# Patient Record
Sex: Female | Born: 1980 | Race: White | Hispanic: No | Marital: Married | Smoking: Former smoker
Health system: Southern US, Community
[De-identification: ages and names within clinical notes are randomized; demographics above are authoritative.]

## PROBLEM LIST (undated history)

## (undated) DIAGNOSIS — F5104 Psychophysiologic insomnia: Secondary | ICD-10-CM

## (undated) DIAGNOSIS — T839XXA Unspecified complication of genitourinary prosthetic device, implant and graft, initial encounter: Secondary | ICD-10-CM

## (undated) DIAGNOSIS — F952 Tourette's disorder: Secondary | ICD-10-CM

## (undated) DIAGNOSIS — N390 Urinary tract infection, site not specified: Secondary | ICD-10-CM

## (undated) DIAGNOSIS — J329 Chronic sinusitis, unspecified: Secondary | ICD-10-CM

## (undated) DIAGNOSIS — O1490 Unspecified pre-eclampsia, unspecified trimester: Secondary | ICD-10-CM

## (undated) DIAGNOSIS — D649 Anemia, unspecified: Secondary | ICD-10-CM

## (undated) DIAGNOSIS — G43909 Migraine, unspecified, not intractable, without status migrainosus: Secondary | ICD-10-CM

## (undated) HISTORY — PX: WISDOM TOOTH EXTRACTION: SHX21

## (undated) HISTORY — DX: Tourette's disorder: F95.2

## (undated) HISTORY — DX: Chronic sinusitis, unspecified: J32.9

## (undated) HISTORY — DX: Urinary tract infection, site not specified: N39.0

## (undated) HISTORY — DX: Unspecified complication of genitourinary prosthetic device, implant and graft, initial encounter: T83.9XXA

## (undated) HISTORY — DX: Unspecified pre-eclampsia, unspecified trimester: O14.90

## (undated) HISTORY — DX: Migraine, unspecified, not intractable, without status migrainosus: G43.909

## (undated) HISTORY — DX: Psychophysiologic insomnia: F51.04

---

## 2006-10-09 ENCOUNTER — Emergency Department: Payer: Self-pay | Admitting: Emergency Medicine

## 2007-03-01 ENCOUNTER — Emergency Department: Payer: Self-pay | Admitting: Emergency Medicine

## 2007-03-16 ENCOUNTER — Emergency Department: Payer: Self-pay | Admitting: Emergency Medicine

## 2007-10-08 LAB — HM PAP SMEAR: HM Pap smear: NORMAL

## 2012-10-07 ENCOUNTER — Encounter: Payer: Self-pay | Admitting: Internal Medicine

## 2012-10-07 ENCOUNTER — Ambulatory Visit (INDEPENDENT_AMBULATORY_CARE_PROVIDER_SITE_OTHER): Payer: No Typology Code available for payment source | Admitting: Internal Medicine

## 2012-10-07 VITALS — BP 120/70 | HR 76 | Temp 98.9°F | Ht 69.5 in | Wt 164.5 lb

## 2012-10-07 DIAGNOSIS — Z0000 Encounter for general adult medical examination without abnormal findings: Secondary | ICD-10-CM

## 2012-10-07 DIAGNOSIS — J309 Allergic rhinitis, unspecified: Secondary | ICD-10-CM | POA: Insufficient documentation

## 2012-10-07 DIAGNOSIS — I7300 Raynaud's syndrome without gangrene: Secondary | ICD-10-CM

## 2012-10-07 DIAGNOSIS — F5104 Psychophysiologic insomnia: Secondary | ICD-10-CM | POA: Insufficient documentation

## 2012-10-07 DIAGNOSIS — N920 Excessive and frequent menstruation with regular cycle: Secondary | ICD-10-CM | POA: Insufficient documentation

## 2012-10-07 DIAGNOSIS — G43909 Migraine, unspecified, not intractable, without status migrainosus: Secondary | ICD-10-CM | POA: Insufficient documentation

## 2012-10-07 DIAGNOSIS — R5383 Other fatigue: Secondary | ICD-10-CM | POA: Insufficient documentation

## 2012-10-07 DIAGNOSIS — F952 Tourette's disorder: Secondary | ICD-10-CM

## 2012-10-07 DIAGNOSIS — G4700 Insomnia, unspecified: Secondary | ICD-10-CM

## 2012-10-07 HISTORY — DX: Excessive and frequent menstruation with regular cycle: N92.0

## 2012-10-07 LAB — CBC WITH DIFFERENTIAL/PLATELET
Basophils Absolute: 0 10*3/uL (ref 0.0–0.1)
Eosinophils Relative: 1.5 % (ref 0.0–5.0)
HCT: 38.7 % (ref 36.0–46.0)
Hemoglobin: 12.7 g/dL (ref 12.0–15.0)
Lymphocytes Relative: 28.4 % (ref 12.0–46.0)
Monocytes Relative: 6.1 % (ref 3.0–12.0)
Neutro Abs: 4.2 10*3/uL (ref 1.4–7.7)
RBC: 4.3 Mil/uL (ref 3.87–5.11)
RDW: 12.5 % (ref 11.5–14.6)
WBC: 6.7 10*3/uL (ref 4.5–10.5)

## 2012-10-07 LAB — COMPREHENSIVE METABOLIC PANEL
ALT: 12 U/L (ref 0–35)
Albumin: 3.8 g/dL (ref 3.5–5.2)
Alkaline Phosphatase: 50 U/L (ref 39–117)
CO2: 27 mEq/L (ref 19–32)
GFR: 125.9 mL/min (ref >60.00)
Potassium: 4.4 mEq/L (ref 3.5–5.1)
Sodium: 138 mEq/L (ref 135–145)
Total Bilirubin: 0.4 mg/dL (ref 0.3–1.2)
Total Protein: 7.4 g/dL (ref 6.0–8.3)

## 2012-10-07 LAB — TSH: TSH: 1.84 u[IU]/mL (ref 0.35–5.50)

## 2012-10-07 LAB — LIPID PANEL
LDL Cholesterol: 95 mg/dL (ref 0–99)
VLDL: 8.2 mg/dL (ref 0.0–40.0)

## 2012-10-07 MED ORDER — FLUTICASONE PROPIONATE 50 MCG/ACT NA SUSP: 2.0000 | Freq: Every day | NASAL | Status: DC

## 2012-10-07 NOTE — Assessment & Plan Note (Signed)
Symptoms of fatigue likely secondary to iron deficiency. As above, will start supplementation with ferrous sulfate. Other lab work including thyroid function, kidney and liver function, and electrolytes were normal. B12 was slightly low and will have patient start B12 supplementation as she is vegetarian. Recommended 2000 mcg sublingually daily. Followup in one month.

## 2012-10-07 NOTE — Assessment & Plan Note (Signed)
Symptoms consistent with menorrhagia. Blood counts are normal today. However, ferritin is low indicated of iron deficiency. Will have patient start ferrous sulfate 324 mg twice daily and plan to repeat CBC and ferritin level in 3 months. We also discussed potentially starting oral contraceptive to help limit to menstrual blood loss. Followup one month.

## 2012-10-07 NOTE — Assessment & Plan Note (Signed)
Symptoms of runny nose, sneezing most consistent with allergic rhinitis. No improvement with Zyrtec or Claritin. Will try adding nasal steroid. Prescription for Flonase given today. Followup one month. If no improvement, would favor referral to ENT for further evaluation including allergy testing.

## 2012-10-07 NOTE — Assessment & Plan Note (Signed)
Symptoms and exam are consistent with Raynauds syndrome. We discussed keeping a hands warm and covered. We also discussed potential use of medication such as a calcium channel blocker. Will send testing for inflammatory autoimmune disorders including ANA, rheumatoid factor, ESR. Followup one month.

## 2012-10-07 NOTE — Assessment & Plan Note (Signed)
Patient with history of migraine headaches recently worsened likely with iron deficiency anemia. Will continue to monitor. If symptoms are persisting, would favor adding rescue medication such as Maxalt.

## 2012-10-07 NOTE — Progress Notes (Signed)
Subjective:    Patient ID: Kelsey Mcguire, female    DOB: 04-Jul-1981, 31 y.o.   MRN: [id number]  HPI 31 year old female with history of migraine headaches, seasonal allergies, insomnia presents to establish care. Her primary concern today is several month history of generalized fatigue. She reports that fatigue is worse around the time of her menstrual cycle. She reports that her menstrual cycles are extremely heavy, with heavy bleeding and blood clots. They last up to a week. In the past, she was placed on oral contraceptive pills to help control blood flow but was unable to tolerate medications because of side effects. She reports that women in her family, mostly on her father's side have extremely heavy periods.  She also notes a long history of insomnia. For years, she has been taking Ambien to help with sleep. Without Ambien, she notes frequent waking at night and daytime fatigue. She questions whether there may be some underlying sleep disorder. She has never had a sleep study. She is unsure if she snores. She does have a history of Tourette's syndrome.  In regards to Tourette's syndrome, she reports that her tics are well controlled when she is rested. There made worse by increased fatigue or anxiety. They're expressed by frequent blinking and occasional grunting. She reports extensive workup for this in the past.   She is also concerned about bluish discoloration of her fingertips and pain in her distal fingers. She reports that her fingers are often "a touch. This is worse during the winter months.  Outpatient Encounter Prescriptions as of 10/07/2012  Medication Sig Dispense Refill  . Melatonin 5 MG TABS Take 1 tablet by mouth daily.      Marland Kitchen zolpidem (AMBIEN) 10 MG tablet Take 10 mg by mouth at bedtime as needed.      . fluticasone (FLONASE) 50 MCG/ACT nasal spray Place 2 sprays into the nose daily.  16 g  6   BP 120/70  Pulse 76  Temp 98.9 F (37.2 C) (Oral)  Ht 5' 9.5" (1.765  m)  Wt 164 lb 8 oz (74.617 kg)  BMI 23.94 kg/m2  SpO2 97%  LMP 09/13/2012  Review of Systems  Constitutional: Positive for fatigue. Negative for fever, chills, appetite change and unexpected weight change.  HENT: Negative for ear pain, congestion, sore throat, trouble swallowing, neck pain, voice change and sinus pressure.   Eyes: Negative for visual disturbance.  Respiratory: Negative for cough, shortness of breath, wheezing and stridor.   Cardiovascular: Negative for chest pain, palpitations and leg swelling.  Gastrointestinal: Negative for nausea, vomiting, abdominal pain, diarrhea, constipation, blood in stool, abdominal distention and anal bleeding.  Genitourinary: Positive for menstrual problem. Negative for dysuria and flank pain.  Musculoskeletal: Negative for myalgias, arthralgias and gait problem.  Skin: Negative for color change and rash.  Neurological: Positive for headaches. Negative for dizziness.  Hematological: Negative for adenopathy. Does not bruise/bleed easily.  Psychiatric/Behavioral: Positive for disturbed wake/sleep cycle. Negative for suicidal ideas and dysphoric mood. The patient is not nervous/anxious.        Objective:   Physical Exam  Constitutional: She is oriented to person, place, and time. She appears well-developed and well-nourished. No distress.  HENT:  Head: Normocephalic and atraumatic.  Right Ear: External ear normal.  Left Ear: External ear normal.  Nose: Nose normal.  Mouth/Throat: Oropharynx is clear and moist. No oropharyngeal exudate.  Eyes: Conjunctivae normal are normal. Pupils are equal, round, and reactive to light. Right eye exhibits no discharge. Left  eye exhibits no discharge. No scleral icterus.  Neck: Normal range of motion. Neck supple. No tracheal deviation present. No thyromegaly present.  Cardiovascular: Normal rate, regular rhythm, normal heart sounds and intact distal pulses.  Exam reveals no gallop and no friction rub.   No  murmur heard. Pulmonary/Chest: Effort normal and breath sounds normal. No respiratory distress. She has no wheezes. She has no rales. She exhibits no tenderness.  Abdominal: Soft. Bowel sounds are normal. She exhibits no distension. There is no tenderness.  Musculoskeletal: Normal range of motion. She exhibits no edema and no tenderness.  Lymphadenopathy:    She has no cervical adenopathy.  Neurological: She is alert and oriented to person, place, and time. No cranial nerve deficit. She exhibits normal muscle tone. Coordination normal.  Skin: Skin is warm and dry. No rash noted. She is not diaphoretic. No erythema. There is pallor (bluish discoloration distal fingers).  Psychiatric: She has a normal mood and affect. Her behavior is normal. Judgment and thought content normal.          Assessment & Plan:

## 2012-10-07 NOTE — Assessment & Plan Note (Signed)
Patient with long history of insomnia on chronic Ambien. She like to get off this medication. She would like to discuss sleep patterns with a sleep specialist to potentially undergo sleep study to see if there are any underlying issues given that she has frequent episodes of waking at night.  Will set up referral.

## 2012-10-07 NOTE — Patient Instructions (Signed)
Raynaud's Syndrome  Raynaud's Syndrome is a disorder of the blood vessels in your hands and feet. It occurs when small arteries of the arms/hands or legs/feet become sensitive to cold or emotional upset. This causes the arteries to constrict, or narrow, and reduces blood flow to the area. The color in the fingers or toes changes from white to bluish to red and this is not usually painful. There may be numbness and tingling. Sores on the skin (ulcers) can form. Symptoms are usually relieved by warming.  HOME CARE INSTRUCTIONS     Avoid exposure to cold. Keep your whole body warm and dry. Dress in layers. Wear mittens or gloves when handling ice or frozen food and when outdoors. Use holders for glasses or cans containing cold drinks. If possible, stay indoors during cold weather.   Limit your use of caffeine. Switch to decaffeinated coffee, tea, and soda pop. Avoid chocolate.   Avoid smoking or being around cigarette smoke. Smoke will make symptoms worse.   Wear loose fitting socks and comfortable, roomy shoes.   Avoid vibrating tools and machinery.   If possible, avoid stressful and emotional situations. Exercise, meditation and yoga may help you cope with stress. Biofeedback may be useful.   Ask your caregiver about medicine (calcium channel blockers) that may control Raynaud's phenomena.  SEEK MEDICAL CARE IF:     Your discomfort becomes worse, despite conservative treatment.   You develop sores on your fingers and toes that do not heal.  Document Released: 11/22/2000 Document Revised: 02/17/2012 Document Reviewed: 11/29/2008  [employer] Patient Information 2013 [employer].

## 2012-10-07 NOTE — Assessment & Plan Note (Signed)
Will request records on previous evaluation. Continue to monitor.

## 2012-10-08 LAB — ANA: Anti Nuclear Antibody(ANA): NEGATIVE

## 2012-10-08 LAB — RHEUMATOID FACTOR: Rhuematoid fact SerPl-aCnc: <10 IU/mL (ref <=14)

## 2012-10-12 ENCOUNTER — Encounter: Payer: Self-pay | Admitting: Internal Medicine

## 2012-11-04 ENCOUNTER — Encounter: Payer: Self-pay | Admitting: Internal Medicine

## 2012-11-04 ENCOUNTER — Ambulatory Visit (INDEPENDENT_AMBULATORY_CARE_PROVIDER_SITE_OTHER): Payer: No Typology Code available for payment source | Admitting: Internal Medicine

## 2012-11-04 VITALS — BP 116/60 | HR 87 | Temp 98.7°F | Ht 69.5 in

## 2012-11-04 DIAGNOSIS — D509 Iron deficiency anemia, unspecified: Secondary | ICD-10-CM | POA: Insufficient documentation

## 2012-11-04 DIAGNOSIS — N920 Excessive and frequent menstruation with regular cycle: Secondary | ICD-10-CM

## 2012-11-04 DIAGNOSIS — D510 Vitamin B12 deficiency anemia due to intrinsic factor deficiency: Secondary | ICD-10-CM | POA: Insufficient documentation

## 2012-11-04 DIAGNOSIS — R5383 Other fatigue: Secondary | ICD-10-CM

## 2012-11-04 DIAGNOSIS — R5381 Other malaise: Secondary | ICD-10-CM

## 2012-11-04 NOTE — Progress Notes (Signed)
Subjective:    Patient ID: Kelsey Mcguire, female    DOB: 1981-01-15, 31 y.o.   MRN: [id number]  HPI 31 year old female with history menorrhagia, migraines, fatigue presents for followup. At her last visit, she was noted to have both iron deficiency with ferritin of 6 and moderately low B12. She started iron supplementation with ferrous sulfate but has had some difficulty with compliance because of diarrhea with this medication. She has also started an oral B12 supplement 2000 mcg daily. She reports that fatigue and headaches are persistent. She also continues to have heavy menstrual cycles. She denies new concerns today.  Outpatient Encounter Prescriptions as of 11/04/2012  Medication Sig Dispense Refill  . cyanocobalamin 2000 MCG tablet Take 2,000 mcg by mouth daily.      . ferrous fumarate (HEMOCYTE - 106 MG FE) 325 (106 FE) MG TABS Take 1 tablet by mouth. Take one to three a day      . fluticasone (FLONASE) 50 MCG/ACT nasal spray Place 2 sprays into the nose daily.  16 g  6  . Melatonin 5 MG TABS Take 1 tablet by mouth daily.      Marland Kitchen zolpidem (AMBIEN) 10 MG tablet Take 10 mg by mouth at bedtime as needed.       BP 116/60  Pulse 87  Temp 98.7 F (37.1 C) (Oral)  Ht 5' 9.5" (1.765 m)  SpO2 98%  LMP 11/03/2012  Review of Systems  Constitutional: Positive for fatigue. Negative for fever, chills, appetite change and unexpected weight change.  HENT: Negative for ear pain, congestion, sore throat, trouble swallowing, neck pain, voice change and sinus pressure.   Eyes: Negative for visual disturbance.  Respiratory: Negative for cough, shortness of breath, wheezing and stridor.   Cardiovascular: Negative for chest pain, palpitations and leg swelling.  Gastrointestinal: Negative for nausea, vomiting, abdominal pain, diarrhea, constipation, blood in stool, abdominal distention and anal bleeding.  Genitourinary: Positive for menstrual problem. Negative for dysuria and flank pain.    Musculoskeletal: Negative for myalgias, arthralgias and gait problem.  Skin: Negative for color change and rash.  Neurological: Positive for headaches. Negative for dizziness.  Hematological: Negative for adenopathy. Does not bruise/bleed easily.  Psychiatric/Behavioral: Negative for suicidal ideas, sleep disturbance and dysphoric mood. The patient is not nervous/anxious.        Objective:   Physical Exam  Constitutional: She is oriented to person, place, and time. She appears well-developed and well-nourished. No distress.  HENT:  Head: Normocephalic and atraumatic.  Right Ear: External ear normal.  Left Ear: External ear normal.  Nose: Nose normal.  Mouth/Throat: Oropharynx is clear and moist. No oropharyngeal exudate.  Eyes: Conjunctivae normal are normal. Pupils are equal, round, and reactive to light. Right eye exhibits no discharge. Left eye exhibits no discharge. No scleral icterus.  Neck: Normal range of motion. Neck supple. No tracheal deviation present. No thyromegaly present.  Cardiovascular: Normal rate, regular rhythm, normal heart sounds and intact distal pulses.  Exam reveals no gallop and no friction rub.   No murmur heard. Pulmonary/Chest: Effort normal and breath sounds normal. No respiratory distress. She has no wheezes. She has no rales. She exhibits no tenderness.  Musculoskeletal: Normal range of motion. She exhibits no edema and no tenderness.  Lymphadenopathy:    She has no cervical adenopathy.  Neurological: She is alert and oriented to person, place, and time. No cranial nerve deficit. She exhibits normal muscle tone. Coordination normal.  Skin: Skin is warm and dry. No rash  noted. She is not diaphoretic. No erythema. No pallor.  Psychiatric: She has a normal mood and affect. Her behavior is normal. Judgment and thought content normal.          Assessment & Plan:

## 2012-11-04 NOTE — Assessment & Plan Note (Signed)
Persistent menorrhagia. Encouraged patient to consider oral contraceptives or IUD to help control menstrual blood loss.

## 2012-11-04 NOTE — Assessment & Plan Note (Signed)
Patient with iron deficiency secondary to menorrhagia. Will repeat ferritin and CBC today, as pt has been taking iron supplements x 1 month. Having some difficulty tolerating oral supplements because of diarrhea, so may need IV iron infusion if iron deficiency persistent. Encouraged her to consider treatment such as OCP to help control menstrual blood loss. Follow up 1 month.

## 2012-11-04 NOTE — Assessment & Plan Note (Addendum)
Symptoms of fatigue are persistent. Suspect related to iron deficiency anemia. Will repeat CBC and ferritin today. If iron deficiency persisting, would favor referring for IV iron infusion. Will also follow up and request results from recent sleep study, to see if sleep apnea is contributing to symptoms of fatigue.

## 2012-11-04 NOTE — Assessment & Plan Note (Signed)
Low B12 noted on recent labs. Pt has been using oral B12 supplement. Will repeat B12 level today to see if any improvement.

## 2012-11-05 LAB — CBC WITH DIFFERENTIAL/PLATELET
Basophils Absolute: 0 10*3/uL (ref 0.0–0.1)
Basophils Relative: 0 % (ref 0–1)
Eosinophils Absolute: 0.1 10*3/uL (ref 0.0–0.7)
HCT: 37.2 % (ref 36.0–46.0)
MCH: 29.6 pg (ref 26.0–34.0)
MCHC: 33.6 g/dL (ref 30.0–36.0)
Monocytes Absolute: 0.3 10*3/uL (ref 0.1–1.0)
Neutro Abs: 2.9 10*3/uL (ref 1.7–7.7)
Neutrophils Relative %: 58 % (ref 43–77)
RDW: 13.4 % (ref 11.5–15.5)

## 2012-11-05 LAB — VITAMIN B12: Vitamin B-12: 656 pg/mL (ref 211–911)

## 2012-11-09 ENCOUNTER — Encounter: Payer: Self-pay | Admitting: Internal Medicine

## 2012-11-16 ENCOUNTER — Encounter: Payer: Self-pay | Admitting: Internal Medicine

## 2012-12-03 ENCOUNTER — Other Ambulatory Visit (INDEPENDENT_AMBULATORY_CARE_PROVIDER_SITE_OTHER): Payer: No Typology Code available for payment source

## 2012-12-03 ENCOUNTER — Telehealth: Payer: Self-pay | Admitting: *Deleted

## 2012-12-03 DIAGNOSIS — Z0000 Encounter for general adult medical examination without abnormal findings: Secondary | ICD-10-CM

## 2012-12-03 LAB — CBC WITH DIFFERENTIAL/PLATELET
Eosinophils Relative: 3.7 % (ref 0.0–5.0)
HCT: 37.4 % (ref 36.0–46.0)
Hemoglobin: 12.5 g/dL (ref 12.0–15.0)
Lymphocytes Relative: 32.5 % (ref 12.0–46.0)
Lymphs Abs: 1.6 10*3/uL (ref 0.7–4.0)
Monocytes Relative: 9.2 % (ref 3.0–12.0)
Platelets: 243 10*3/uL (ref 150.0–400.0)
WBC: 5 10*3/uL (ref 4.5–10.5)

## 2012-12-03 LAB — COMPREHENSIVE METABOLIC PANEL
AST: 29 U/L (ref 0–37)
Alkaline Phosphatase: 52 U/L (ref 39–117)
BUN: 8 mg/dL (ref 6–23)
Calcium: 8.6 mg/dL (ref 8.4–10.5)
Creatinine, Ser: 0.6 mg/dL (ref 0.4–1.2)

## 2012-12-03 LAB — LIPID PANEL
Cholesterol: 130 mg/dL (ref 0–200)
LDL Cholesterol: 69 mg/dL (ref 0–99)
Triglycerides: 75 mg/dL (ref 0.0–149.0)
VLDL: 15 mg/dL (ref 0.0–40.0)

## 2012-12-03 LAB — TSH: TSH: 1.52 u[IU]/mL (ref 0.35–5.50)

## 2012-12-03 LAB — HEMOGLOBIN A1C: Hgb A1c MFr Bld: 5.5 % (ref 4.6–6.5)

## 2012-12-03 NOTE — Telephone Encounter (Signed)
What labs and dx would you like for this pt? Thank you  

## 2012-12-03 NOTE — Telephone Encounter (Signed)
CMP,CBC, lipids, TSH, Vit D, A1c V70.9

## 2012-12-04 MED ORDER — ERGOCALCIFEROL 1.25 MG (50000 UT) PO CAPS: 50000.0000 [IU] | ORAL_CAPSULE | ORAL | Status: DC

## 2012-12-04 NOTE — Addendum Note (Signed)
Addended by: Jackson Latino on: 12/04/2012 12:15 PM   Modules accepted: Orders

## 2013-01-21 ENCOUNTER — Encounter: Payer: No Typology Code available for payment source | Admitting: Internal Medicine

## 2013-01-22 ENCOUNTER — Encounter: Payer: No Typology Code available for payment source | Admitting: Internal Medicine

## 2013-01-23 ENCOUNTER — Other Ambulatory Visit: Payer: Self-pay

## 2013-02-11 ENCOUNTER — Other Ambulatory Visit: Payer: Self-pay | Admitting: Internal Medicine

## 2013-02-22 ENCOUNTER — Encounter: Payer: Self-pay | Admitting: Internal Medicine

## 2013-02-22 ENCOUNTER — Ambulatory Visit (INDEPENDENT_AMBULATORY_CARE_PROVIDER_SITE_OTHER): Payer: No Typology Code available for payment source | Admitting: Internal Medicine

## 2013-02-22 ENCOUNTER — Encounter: Payer: No Typology Code available for payment source | Admitting: Internal Medicine

## 2013-02-22 VITALS — BP 130/88 | HR 79 | Temp 98.5°F | Ht 69.5 in | Wt 175.0 lb

## 2013-02-22 DIAGNOSIS — Z309 Encounter for contraceptive management, unspecified: Secondary | ICD-10-CM | POA: Insufficient documentation

## 2013-02-22 DIAGNOSIS — L708 Other acne: Secondary | ICD-10-CM

## 2013-02-22 DIAGNOSIS — D509 Iron deficiency anemia, unspecified: Secondary | ICD-10-CM | POA: Insufficient documentation

## 2013-02-22 DIAGNOSIS — L709 Acne, unspecified: Secondary | ICD-10-CM | POA: Insufficient documentation

## 2013-02-22 DIAGNOSIS — E559 Vitamin D deficiency, unspecified: Secondary | ICD-10-CM

## 2013-02-22 DIAGNOSIS — N920 Excessive and frequent menstruation with regular cycle: Secondary | ICD-10-CM

## 2013-02-22 DIAGNOSIS — B977 Papillomavirus as the cause of diseases classified elsewhere: Secondary | ICD-10-CM

## 2013-02-22 DIAGNOSIS — R8789 Other abnormal findings in specimens from female genital organs: Secondary | ICD-10-CM

## 2013-02-22 DIAGNOSIS — IMO0002 Reserved for concepts with insufficient information to code with codable children: Secondary | ICD-10-CM | POA: Insufficient documentation

## 2013-02-22 HISTORY — DX: Acne, unspecified: L70.9

## 2013-02-22 HISTORY — DX: Iron deficiency anemia, unspecified: D50.9

## 2013-02-22 MED ORDER — DOXYCYCLINE HYCLATE 50 MG PO CAPS: 50.0000 mg | ORAL_CAPSULE | Freq: Two times a day (BID) | ORAL | Status: DC

## 2013-02-22 MED ORDER — NORGESTIM-ETH ESTRAD TRIPHASIC 0.18/0.215/0.25 MG-25 MCG PO TABS: 1.0000 | ORAL_TABLET | Freq: Every day | ORAL | Status: DC

## 2013-02-22 NOTE — Assessment & Plan Note (Signed)
Started on Doxycycline by dermatologist, however having some nausea/vomiting on this med. Encouraged her to try stopping medication for 5-7 days to see if any improvement.

## 2013-02-22 NOTE — Assessment & Plan Note (Signed)
Secondary to menorrhagia. Will check CBC and ferritin with labs today.

## 2013-02-22 NOTE — Assessment & Plan Note (Signed)
Per pt report. Will request notes on recent eval.

## 2013-02-22 NOTE — Progress Notes (Signed)
Subjective:    Patient ID: Kelsey Mcguire, female    DOB: [birth year], 32 y.o.   MRN: [id number]  HPI 31YO female with h/o menorrhagia, iron deficiency anemia presents for follow up. Notes she recently had PAP with OB, which was HPV pos and abnormal. Scheduled for colposcopy next week. Continues to have heavy menstrual bleeding despite starting OCP. Menses presently last over 10 days with heavy clotting and intermittent left lower abdominal pain. Scheduled for pelvic US in 2 weeks with OB.  Outpatient Encounter Prescriptions as of 02/22/2013  Medication Sig Dispense Refill  . clonazePAM (KLONOPIN) 1 MG tablet Take 1 mg by mouth 2 (two) times daily as needed for anxiety.      . cyanocobalamin 2000 MCG tablet Take 2,000 mcg by mouth daily.      . ferrous fumarate (HEMOCYTE - 106 MG FE) 325 (106 FE) MG TABS Take 1 tablet by mouth. Take one to three a day      . fluticasone (FLONASE) 50 MCG/ACT nasal spray Place 2 sprays into the nose daily.  16 g  6  . zolpidem (AMBIEN) 10 MG tablet Take 10 mg by mouth at bedtime as needed.      . doxycycline (VIBRAMYCIN) 50 MG capsule Take 1 capsule (50 mg total) by mouth 2 (two) times daily.  60 capsule  3  . Melatonin 5 MG TABS Take 1 tablet by mouth daily.      . Norgestimate-Ethinyl Estradiol Triphasic (ORTHO TRI-CYCLEN LO) 0.18/0.215/0.25 MG-25 MCG tab Take 1 tablet by mouth daily.  1 Package  11  . [DISCONTINUED] ergocalciferol (DRISDOL) 50000 UNITS capsule Take 1 capsule (50,000 Units total) by mouth once a week.  12 capsule  0   No facility-administered encounter medications on file as of 02/22/2013.   BP 130/88  Pulse 79  Temp(Src) 98.5 F (36.9 C) (Oral)  Ht 5' 9.5" (1.765 m)  Wt 175 lb (79.379 kg)  BMI 25.48 kg/m2  SpO2 99%  LMP 02/22/2013  Review of Systems  Constitutional: Negative for fever, chills, appetite change, fatigue and unexpected weight change.  HENT: Negative for ear pain, congestion, sore throat, trouble swallowing, neck  pain, voice change and sinus pressure.   Eyes: Negative for visual disturbance.  Respiratory: Negative for cough, shortness of breath, wheezing and stridor.   Cardiovascular: Negative for chest pain, palpitations and leg swelling.  Gastrointestinal: Negative for nausea, vomiting, abdominal pain, diarrhea, constipation, blood in stool, abdominal distention and anal bleeding.  Genitourinary: Positive for vaginal bleeding, menstrual problem and pelvic pain (left lower quad). Negative for dysuria and flank pain.  Musculoskeletal: Negative for myalgias, arthralgias and gait problem.  Skin: Negative for color change and rash.  Neurological: Negative for dizziness and headaches.  Hematological: Negative for adenopathy. Does not bruise/bleed easily.  Psychiatric/Behavioral: Negative for suicidal ideas, sleep disturbance and dysphoric mood. The patient is not nervous/anxious.        Objective:   Physical Exam  Constitutional: She is oriented to person, place, and time. She appears well-developed and well-nourished. No distress.  HENT:  Head: Normocephalic and atraumatic.  Right Ear: External ear normal.  Left Ear: External ear normal.  Nose: Nose normal.  Mouth/Throat: Oropharynx is clear and moist. No oropharyngeal exudate.  Eyes: Conjunctivae are normal. Pupils are equal, round, and reactive to light. Right eye exhibits no discharge. Left eye exhibits no discharge. No scleral icterus.  Neck: Normal range of motion. Neck supple. No tracheal deviation present. No thyromegaly present.  Cardiovascular: Normal rate,  regular rhythm, normal heart sounds and intact distal pulses.  Exam reveals no gallop and no friction rub.   No murmur heard. Pulmonary/Chest: Effort normal and breath sounds normal. No respiratory distress. She has no wheezes. She has no rales. She exhibits no tenderness.  Abdominal: Soft. She exhibits no distension and no mass. There is tenderness (left lower quad). There is no rebound  and no guarding.  Musculoskeletal: Normal range of motion. She exhibits no edema and no tenderness.  Lymphadenopathy:    She has no cervical adenopathy.  Neurological: She is alert and oriented to person, place, and time. No cranial nerve deficit. She exhibits normal muscle tone. Coordination normal.  Skin: Skin is warm and dry. No rash noted. She is not diaphoretic. No erythema. No pallor.  Psychiatric: She has a normal mood and affect. Her behavior is normal. Judgment and thought content normal.          Assessment & Plan:

## 2013-02-22 NOTE — Assessment & Plan Note (Signed)
Persistent symptoms of menorrhagia despite use of OCP. Pelvic US scheduled with OB. Question if she may have uterine fibroid or endometriosis contributing. Also discussed potential testing for VonWillebrand's disease. Will wait until GYN eval complete. Repeat CBC today.

## 2013-02-22 NOTE — Assessment & Plan Note (Signed)
Will check VIt D with labs today.

## 2013-02-23 ENCOUNTER — Encounter: Payer: Self-pay | Admitting: Internal Medicine

## 2013-02-23 LAB — CBC WITH DIFFERENTIAL/PLATELET
Eosinophils Relative: 1.1 % (ref 0.0–5.0)
HCT: 35.4 % — ABNORMAL LOW (ref 36.0–46.0)
Hemoglobin: 11.9 g/dL — ABNORMAL LOW (ref 12.0–15.0)
Lymphs Abs: 1.6 10*3/uL (ref 0.7–4.0)
Monocytes Relative: 2.3 % — ABNORMAL LOW (ref 3.0–12.0)
Neutro Abs: 4.1 10*3/uL (ref 1.4–7.7)
WBC: 6.1 10*3/uL (ref 4.5–10.5)

## 2013-02-23 LAB — FERRITIN: Ferritin: 21.1 ng/mL (ref 10.0–291.0)

## 2013-02-23 LAB — VITAMIN D 25 HYDROXY (VIT D DEFICIENCY, FRACTURES): Vit D, 25-Hydroxy: 37 ng/mL (ref 30–89)

## 2013-02-24 ENCOUNTER — Telehealth: Payer: Self-pay | Admitting: Internal Medicine

## 2013-02-24 ENCOUNTER — Encounter: Payer: Self-pay | Admitting: Internal Medicine

## 2013-02-24 NOTE — Telephone Encounter (Signed)
PAP ASCUS

## 2013-07-14 ENCOUNTER — Encounter: Payer: Self-pay | Admitting: Internal Medicine

## 2013-10-14 ENCOUNTER — Other Ambulatory Visit: Payer: Self-pay

## 2013-12-03 ENCOUNTER — Ambulatory Visit: Payer: No Typology Code available for payment source | Admitting: Internal Medicine

## 2013-12-06 ENCOUNTER — Ambulatory Visit: Payer: No Typology Code available for payment source | Admitting: Internal Medicine

## 2013-12-20 ENCOUNTER — Encounter: Payer: Self-pay | Admitting: Internal Medicine

## 2013-12-20 ENCOUNTER — Ambulatory Visit (INDEPENDENT_AMBULATORY_CARE_PROVIDER_SITE_OTHER): Payer: No Typology Code available for payment source | Admitting: Internal Medicine

## 2013-12-20 VITALS — BP 110/70 | HR 77 | Temp 98.2°F | Wt 181.0 lb

## 2013-12-20 DIAGNOSIS — J309 Allergic rhinitis, unspecified: Secondary | ICD-10-CM

## 2013-12-20 DIAGNOSIS — N920 Excessive and frequent menstruation with regular cycle: Secondary | ICD-10-CM

## 2013-12-20 DIAGNOSIS — Z0000 Encounter for general adult medical examination without abnormal findings: Secondary | ICD-10-CM

## 2013-12-20 DIAGNOSIS — G4700 Insomnia, unspecified: Secondary | ICD-10-CM

## 2013-12-20 LAB — COMPREHENSIVE METABOLIC PANEL
ALBUMIN: 3.9 g/dL (ref 3.5–5.2)
ALK PHOS: 51 U/L (ref 39–117)
ALT: 13 U/L (ref 0–35)
AST: 16 U/L (ref 0–37)
BILIRUBIN TOTAL: 0.9 mg/dL (ref 0.3–1.2)
BUN: 11 mg/dL (ref 6–23)
CO2: 26 mEq/L (ref 19–32)
Calcium: 9.5 mg/dL (ref 8.4–10.5)
Chloride: 105 mEq/L (ref 96–112)
Creatinine, Ser: 0.6 mg/dL (ref 0.4–1.2)
GFR: 122.55 mL/min (ref >60.00)
GLUCOSE: 94 mg/dL (ref 70–99)
POTASSIUM: 4.5 meq/L (ref 3.5–5.1)
SODIUM: 137 meq/L (ref 135–145)
TOTAL PROTEIN: 6.8 g/dL (ref 6.0–8.3)

## 2013-12-20 LAB — LIPID PANEL
CHOL/HDL RATIO: 3
CHOLESTEROL: 143 mg/dL (ref 0–200)
HDL: 55.4 mg/dL (ref >39.00)
LDL CALC: 78 mg/dL (ref 0–99)
Triglycerides: 49 mg/dL (ref 0.0–149.0)
VLDL: 9.8 mg/dL (ref 0.0–40.0)

## 2013-12-20 LAB — CBC WITH DIFFERENTIAL/PLATELET
BASOS PCT: 0.5 % (ref 0.0–3.0)
Basophils Absolute: 0 10*3/uL (ref 0.0–0.1)
EOS ABS: 0.2 10*3/uL (ref 0.0–0.7)
EOS PCT: 3.5 % (ref 0.0–5.0)
HEMATOCRIT: 38.3 % (ref 36.0–46.0)
Hemoglobin: 13 g/dL (ref 12.0–15.0)
LYMPHS ABS: 1.7 10*3/uL (ref 0.7–4.0)
Lymphocytes Relative: 26.3 % (ref 12.0–46.0)
MCHC: 34 g/dL (ref 30.0–36.0)
MCV: 90.6 fl (ref 78.0–100.0)
MONO ABS: 0.4 10*3/uL (ref 0.1–1.0)
Monocytes Relative: 6.4 % (ref 3.0–12.0)
Neutro Abs: 4 10*3/uL (ref 1.4–7.7)
Neutrophils Relative %: 63.3 % (ref 43.0–77.0)
PLATELETS: 266 10*3/uL (ref 150.0–400.0)
RBC: 4.23 Mil/uL (ref 3.87–5.11)
RDW: 12.5 % (ref 11.5–14.6)
WBC: 6.3 10*3/uL (ref 4.5–10.5)

## 2013-12-20 LAB — FERRITIN: Ferritin: 51.6 ng/mL (ref 10.0–291.0)

## 2013-12-20 MED ORDER — FLUTICASONE PROPIONATE 50 MCG/ACT NA SUSP: 2.0000 | Freq: Every day | NASAL | Status: DC

## 2013-12-20 NOTE — Assessment & Plan Note (Signed)
Persistent menorrhagia. Unable to tolerate oral contraceptives. Unable to tolerate IUD. Question if she may have von Willebrand's disease. Will send CBC, ferritin, and von Willebrand panel today. If von Willebrand's disease, may benefit from Stimate.

## 2013-12-20 NOTE — Progress Notes (Signed)
Subjective:    Patient ID: Kelsey Mcguire, female    DOB: 04-12-1981, 33 y.o.   MRN: [id number]  HPI 33 year old female presents for followup. At her last visit, she was concerned about menorrhagia. She followed up with OB/GYN and IUD was placed. Unfortunately, the IUD came out spontaneously during her honeymoon. She reports significant pain with this event. Since that time, she has had very heavy menstrual bleeding each month. She notes some fatigue. She questions whether she may be anemic again.  In regards to her history of abnormal Pap, she reports that she has been followed every 4 months by her OB/GYN and repeat Paps have been normal.  Outpatient Encounter Prescriptions as of 12/20/2013  Medication Sig  . clonazePAM (KLONOPIN) 1 MG tablet Take 1 mg by mouth 2 (two) times daily as needed for anxiety.  . cyanocobalamin 2000 MCG tablet Take 2,000 mcg by mouth daily.  . ferrous fumarate (HEMOCYTE - 106 MG FE) 325 (106 FE) MG TABS Take 1 tablet by mouth. Take one to three a day  . fluticasone (FLONASE) 50 MCG/ACT nasal spray Place 2 sprays into both nostrils daily.  . Melatonin 5 MG TABS Take 1 tablet by mouth daily.  Marland Kitchen zolpidem (AMBIEN) 10 MG tablet Take 10 mg by mouth at bedtime as needed.   BP 110/70  Pulse 77  Temp(Src) 98.2 F (36.8 C) (Oral)  Wt 181 lb (82.101 kg)  SpO2 99%  Review of Systems  Constitutional: Negative for fever, chills, appetite change, fatigue and unexpected weight change.  HENT: Negative for congestion, ear pain, sinus pressure, sore throat, trouble swallowing and voice change.   Eyes: Negative for visual disturbance.  Respiratory: Negative for cough, shortness of breath, wheezing and stridor.   Cardiovascular: Negative for chest pain, palpitations and leg swelling.  Gastrointestinal: Negative for nausea, vomiting, abdominal pain, diarrhea, constipation, blood in stool, abdominal distention and anal bleeding.  Genitourinary: Positive for menstrual problem.  Negative for dysuria and flank pain.  Musculoskeletal: Negative for arthralgias, gait problem, myalgias and neck pain.  Skin: Negative for color change and rash.  Neurological: Negative for dizziness and headaches.  Hematological: Negative for adenopathy. Does not bruise/bleed easily.  Psychiatric/Behavioral: Negative for suicidal ideas, sleep disturbance and dysphoric mood. The patient is not nervous/anxious.        Objective:   Physical Exam  Constitutional: She is oriented to person, place, and time. She appears well-developed and well-nourished. No distress.  HENT:  Head: Normocephalic and atraumatic.  Right Ear: External ear normal.  Left Ear: External ear normal.  Nose: Nose normal.  Mouth/Throat: Oropharynx is clear and moist. No oropharyngeal exudate.  Eyes: Conjunctivae are normal. Pupils are equal, round, and reactive to light. Right eye exhibits no discharge. Left eye exhibits no discharge. No scleral icterus.  Neck: Normal range of motion. Neck supple. No tracheal deviation present. No thyromegaly present.  Cardiovascular: Normal rate, regular rhythm, normal heart sounds and intact distal pulses.  Exam reveals no gallop and no friction rub.   No murmur heard. Pulmonary/Chest: Effort normal and breath sounds normal. No accessory muscle usage. Not tachypneic. No respiratory distress. She has no decreased breath sounds. She has no wheezes. She has no rhonchi. She has no rales. She exhibits no tenderness.  Musculoskeletal: Normal range of motion. She exhibits no edema and no tenderness.  Lymphadenopathy:    She has no cervical adenopathy.  Neurological: She is alert and oriented to person, place, and time. No cranial nerve deficit. She  exhibits normal muscle tone. Coordination normal.  Skin: Skin is warm and dry. No rash noted. She is not diaphoretic. No erythema. No pallor.  Psychiatric: She has a normal mood and affect. Her behavior is normal. Judgment and thought content  normal.          Assessment & Plan:

## 2013-12-20 NOTE — Progress Notes (Signed)
Pre-visit discussion using our clinic review tool. No additional management support is needed unless otherwise documented below in the visit note.  

## 2013-12-20 NOTE — Assessment & Plan Note (Signed)
Symptoms well controlled with Ambien. Will continue. 

## 2013-12-20 NOTE — Assessment & Plan Note (Signed)
Symptoms controlled with Flonase. Will continue.

## 2013-12-23 LAB — VON WILLEBRAND PANEL
COAGULATION FACTOR VIII: 24 % — AB (ref 73–140)
RISTOCETIN CO-FACTOR, PLASMA: 69 % (ref 42–200)
Von Willebrand Antigen, Plasma: 85 % (ref 50–217)

## 2014-01-08 ENCOUNTER — Telehealth: Payer: Self-pay | Admitting: Internal Medicine

## 2014-01-08 NOTE — Telephone Encounter (Signed)
Relevant patient education assigned to patient using Emmi. ° °

## 2014-06-20 ENCOUNTER — Encounter: Payer: Self-pay | Admitting: Internal Medicine

## 2014-06-20 ENCOUNTER — Ambulatory Visit (INDEPENDENT_AMBULATORY_CARE_PROVIDER_SITE_OTHER): Payer: No Typology Code available for payment source | Admitting: Internal Medicine

## 2014-06-20 VITALS — BP 104/66 | HR 87 | Temp 98.4°F | Ht 69.5 in | Wt 149.2 lb

## 2014-06-20 DIAGNOSIS — G4700 Insomnia, unspecified: Secondary | ICD-10-CM

## 2014-06-20 DIAGNOSIS — IMO0002 Reserved for concepts with insufficient information to code with codable children: Secondary | ICD-10-CM

## 2014-06-20 DIAGNOSIS — R8789 Other abnormal findings in specimens from female genital organs: Secondary | ICD-10-CM

## 2014-06-20 DIAGNOSIS — N920 Excessive and frequent menstruation with regular cycle: Secondary | ICD-10-CM

## 2014-06-20 DIAGNOSIS — F952 Tourette's disorder: Secondary | ICD-10-CM

## 2014-06-20 DIAGNOSIS — N921 Excessive and frequent menstruation with irregular cycle: Secondary | ICD-10-CM

## 2014-06-20 DIAGNOSIS — B977 Papillomavirus as the cause of diseases classified elsewhere: Secondary | ICD-10-CM

## 2014-06-20 NOTE — Patient Instructions (Signed)
We will set you up with Dr. Jeneen Rinks, a GYN specialist at [facility].  Follow up as needed.

## 2014-06-20 NOTE — Assessment & Plan Note (Signed)
Symptoms well controlled with Ambien. Will continue. 

## 2014-06-20 NOTE — Assessment & Plan Note (Signed)
Persistent menorrhagia. Unable to tolerate OCP or IUD. Will set up evaluation with GYN. Pt prefers to hold off on additional labs until GYN eval complete. Question if she may have VWD and might benefit from Stimate. Factor VIII level was low in the past.

## 2014-06-20 NOTE — Progress Notes (Signed)
Pre visit review using our clinic review tool, if applicable. No additional management support is needed unless otherwise documented below in the visit note. 

## 2014-06-20 NOTE — Assessment & Plan Note (Signed)
Symptoms have been well controlled with Clonazepam. Followed by psychiatry, but unable to afford q19month visits. Will follow here.

## 2014-06-20 NOTE — Assessment & Plan Note (Signed)
Will request recent follow up PAP from her GYN.

## 2014-06-20 NOTE — Progress Notes (Signed)
Subjective:    Patient ID: Kelsey Mcguire, female    DOB: 1981/03/07, 33 y.o.   MRN: [id number]  HPI 33YO female presents for follow up.  Continues to have issues with heavy, irregular periods. Has been bleeding for over 2 weeks. Feels lightheaded with standing. Frustrated by local GYN care. Has been unable to tolerate OCP because of malaise and weight gain on OCP. Had IUD placed, but this fell out. Would like to have second opinion.  In regards to Tourettes symptoms, insomnia has been well controlled with Ambien. Would like to have Rx filled here, as insurance will not cover evaluation with psychiatry.  Review of Systems  Constitutional: Negative for fever, chills, appetite change, fatigue and unexpected weight change.  Eyes: Negative for visual disturbance.  Respiratory: Negative for shortness of breath.   Cardiovascular: Negative for chest pain and leg swelling.  Gastrointestinal: Negative for abdominal pain.  Genitourinary: Positive for vaginal bleeding and menstrual problem. Negative for dysuria, frequency, flank pain and pelvic pain.  Skin: Negative for color change and rash.  Hematological: Negative for adenopathy. Does not bruise/bleed easily.  Psychiatric/Behavioral: Positive for sleep disturbance. Negative for dysphoric mood. The patient is not nervous/anxious.        Objective:    BP 104/66  Pulse 87  Temp(Src) 98.4 F (36.9 C) (Oral)  Ht 5' 9.5" (1.765 m)  Wt 149 lb 4 oz (67.699 kg)  BMI 21.73 kg/m2  SpO2 98%  LMP 06/17/2014 Physical Exam  Constitutional: She is oriented to person, place, and time. She appears well-developed and well-nourished. No distress.  HENT:  Head: Normocephalic and atraumatic.  Right Ear: External ear normal.  Left Ear: External ear normal.  Nose: Nose normal.  Mouth/Throat: Oropharynx is clear and moist. No oropharyngeal exudate.  Eyes: Conjunctivae are normal. Pupils are equal, round, and reactive to light. Right eye exhibits no  discharge. Left eye exhibits no discharge. No scleral icterus.  Neck: Normal range of motion. Neck supple. No tracheal deviation present. No thyromegaly present.  Cardiovascular: Normal rate, regular rhythm, normal heart sounds and intact distal pulses.  Exam reveals no gallop and no friction rub.   No murmur heard. Pulmonary/Chest: Effort normal and breath sounds normal. No accessory muscle usage. Not tachypneic. No respiratory distress. She has no decreased breath sounds. She has no wheezes. She has no rhonchi. She has no rales. She exhibits no tenderness.  Musculoskeletal: Normal range of motion. She exhibits no edema and no tenderness.  Lymphadenopathy:    She has no cervical adenopathy.  Neurological: She is alert and oriented to person, place, and time. No cranial nerve deficit. She exhibits normal muscle tone. Coordination normal.  Skin: Skin is warm and dry. No rash noted. She is not diaphoretic. No erythema. No pallor.  Psychiatric: She has a normal mood and affect. Her behavior is normal. Judgment and thought content normal.          Assessment & Plan:   Problem List Items Addressed This Visit     Unprioritized   Abnormal cervical Pap smear with positive HPV DNA test     Will request recent follow up PAP from her GYN.    Insomnia     Symptoms well controlled with Ambien. Will continue.    Menorrhagia - Primary     Persistent menorrhagia. Unable to tolerate OCP or IUD. Will set up evaluation with GYN. Pt prefers to hold off on additional labs until GYN eval complete. Question if she may have VWD  and might benefit from Stimate. Factor VIII level was low in the past.    Relevant Orders      Ambulatory referral to Gynecology   Tourette syndrome     Symptoms have been well controlled with Clonazepam. Followed by psychiatry, but unable to afford q61month visits. Will follow here.        Return in about 6 months (around 12/21/2014) for Physical.

## 2014-12-27 ENCOUNTER — Ambulatory Visit: Payer: No Typology Code available for payment source | Admitting: Internal Medicine

## 2015-01-27 ENCOUNTER — Encounter: Payer: Self-pay | Admitting: Internal Medicine

## 2015-01-27 ENCOUNTER — Ambulatory Visit (INDEPENDENT_AMBULATORY_CARE_PROVIDER_SITE_OTHER): Payer: No Typology Code available for payment source | Admitting: Internal Medicine

## 2015-01-27 VITALS — BP 109/73 | HR 75 | Temp 98.8°F | Ht 69.5 in | Wt 161.2 lb

## 2015-01-27 DIAGNOSIS — M7520 Bicipital tendinitis, unspecified shoulder: Secondary | ICD-10-CM | POA: Insufficient documentation

## 2015-01-27 DIAGNOSIS — N921 Excessive and frequent menstruation with irregular cycle: Secondary | ICD-10-CM

## 2015-01-27 DIAGNOSIS — M7521 Bicipital tendinitis, right shoulder: Secondary | ICD-10-CM

## 2015-01-27 DIAGNOSIS — F952 Tourette's disorder: Secondary | ICD-10-CM

## 2015-01-27 LAB — CBC WITH DIFFERENTIAL/PLATELET
BASOS ABS: 0 10*3/uL (ref 0.0–0.1)
BASOS PCT: 0.5 % (ref 0.0–3.0)
Eosinophils Absolute: 0.2 10*3/uL (ref 0.0–0.7)
Eosinophils Relative: 4.1 % (ref 0.0–5.0)
HCT: 37.7 % (ref 36.0–46.0)
HEMOGLOBIN: 13 g/dL (ref 12.0–15.0)
Lymphocytes Relative: 30.5 % (ref 12.0–46.0)
Lymphs Abs: 1.5 10*3/uL (ref 0.7–4.0)
MCHC: 34.5 g/dL (ref 30.0–36.0)
MCV: 90.3 fl (ref 78.0–100.0)
Monocytes Absolute: 0.3 10*3/uL (ref 0.1–1.0)
Monocytes Relative: 6.1 % (ref 3.0–12.0)
NEUTROS PCT: 58.8 % (ref 43.0–77.0)
Neutro Abs: 3 10*3/uL (ref 1.4–7.7)
Platelets: 233 10*3/uL (ref 150.0–400.0)
RBC: 4.18 Mil/uL (ref 3.87–5.11)
RDW: 12.6 % (ref 11.5–15.5)
WBC: 5.1 10*3/uL (ref 4.0–10.5)

## 2015-01-27 LAB — COMPREHENSIVE METABOLIC PANEL
ALT: 13 U/L (ref 0–35)
AST: 15 U/L (ref 0–37)
Albumin: 4 g/dL (ref 3.5–5.2)
Alkaline Phosphatase: 44 U/L (ref 39–117)
BUN: 15 mg/dL (ref 6–23)
CALCIUM: 9.3 mg/dL (ref 8.4–10.5)
CHLORIDE: 105 meq/L (ref 96–112)
CO2: 27 meq/L (ref 19–32)
Creatinine, Ser: 0.6 mg/dL (ref 0.40–1.20)
GFR: 121.73 mL/min (ref >60.00)
GLUCOSE: 97 mg/dL (ref 70–99)
POTASSIUM: 4.7 meq/L (ref 3.5–5.1)
SODIUM: 137 meq/L (ref 135–145)
TOTAL PROTEIN: 6.7 g/dL (ref 6.0–8.3)
Total Bilirubin: 0.6 mg/dL (ref 0.2–1.2)

## 2015-01-27 LAB — FERRITIN: Ferritin: 50 ng/mL (ref 10.0–291.0)

## 2015-01-27 LAB — T4, FREE: Free T4: 0.93 ng/dL (ref 0.60–1.60)

## 2015-01-27 LAB — VITAMIN B12: VITAMIN B 12: 597 pg/mL (ref 211–911)

## 2015-01-27 LAB — TSH: TSH: 1.92 u[IU]/mL (ref 0.35–4.50)

## 2015-01-27 NOTE — Assessment & Plan Note (Signed)
Continued menorrhagia. No improvement with medication prescribed by OB. Discussed repeating Pelvic US to re-evaluate uterine fibroid. She prefers to hold off on this. Will check CBC, ferritin with labs.

## 2015-01-27 NOTE — Progress Notes (Signed)
Subjective:    Patient ID: Kelsey Mcguire, female    DOB: Mar 27, 1981, 34 y.o.   MRN: [id number]  HPI  34YO female presents for follow up.  Recently has been tapering Clonazepam. Menses have been very heavy, with severe cramping. Had to call out of work.  Seen at [facility] in 08/2014, had pelvic US which showed 2cm fibroid.  Last 2-3 weeks has had pain in anterior right upper arm, after injury which occurred when walking dog and he pulled suddenly on leash. Gradually seems to be getting better. Not taking anything for pain.  Past medical, surgical, family and social history per today's encounter.  Review of Systems  Constitutional: Negative for fever, chills, appetite change, fatigue and unexpected weight change.  Eyes: Negative for visual disturbance.  Respiratory: Negative for shortness of breath.   Cardiovascular: Negative for chest pain and leg swelling.  Gastrointestinal: Negative for abdominal pain, diarrhea and constipation.  Genitourinary: Positive for menstrual problem. Negative for pelvic pain.  Musculoskeletal: Positive for myalgias. Negative for arthralgias.  Skin: Negative for color change and rash.  Neurological: Negative for weakness and numbness.  Hematological: Negative for adenopathy. Does not bruise/bleed easily.  Psychiatric/Behavioral: Negative for suicidal ideas, sleep disturbance and dysphoric mood. The patient is not nervous/anxious.        Objective:    BP 109/73 mmHg  Pulse 75  Temp(Src) 98.8 F (37.1 C) (Oral)  Ht 5' 9.5" (1.765 m)  Wt 161 lb 4 oz (73.143 kg)  BMI 23.48 kg/m2  SpO2 100%  LMP 01/07/2015 Physical Exam  Constitutional: She is oriented to person, place, and time. She appears well-developed and well-nourished. No distress.  HENT:  Head: Normocephalic and atraumatic.  Right Ear: External ear normal.  Left Ear: External ear normal.  Nose: Nose normal.  Mouth/Throat: Oropharynx is clear and moist. No oropharyngeal exudate.  Eyes:  Conjunctivae and EOM are normal. Pupils are equal, round, and reactive to light. Right eye exhibits no discharge.  Neck: Normal range of motion. Neck supple. No thyromegaly present.  Cardiovascular: Normal rate, regular rhythm, normal heart sounds and intact distal pulses.  Exam reveals no gallop and no friction rub.   No murmur heard. Pulmonary/Chest: Effort normal. No respiratory distress. She has no wheezes. She has no rales.  Abdominal: Soft. Bowel sounds are normal. She exhibits no distension and no mass. There is no tenderness. There is no rebound and no guarding.  Musculoskeletal: Normal range of motion. She exhibits no edema or tenderness.       Arms: Lymphadenopathy:    She has no cervical adenopathy.  Neurological: She is alert and oriented to person, place, and time. No cranial nerve deficit. Coordination normal.  Skin: Skin is warm and dry. No rash noted. She is not diaphoretic. No erythema. No pallor.  Psychiatric: She has a normal mood and affect. Her behavior is normal. Judgment and thought content normal.          Assessment & Plan:   Problem List Items Addressed This Visit      Unprioritized   Biceps tendonitis    Right biceps tendonitis, with possible small tear. Discussed conservative management including icing area, limiting lifting and using NSAIDS. If no improvement, we discussed referral to Sports Med. Follow up in 4 weeks.      Menorrhagia - Primary    Continued menorrhagia. No improvement with medication prescribed by OB. Discussed repeating Pelvic US to re-evaluate uterine fibroid. She prefers to hold off on this. Will check  CBC, ferritin with labs.      Relevant Orders   CBC with Differential/Platelet   Comprehensive metabolic panel   TSH   T4, free   Ferritin   B12   Tourette syndrome    Symptoms slightly worse with taper of Clonazepam. Will continue to monitor.          Return in about 4 weeks (around 02/24/2015) for Physical with PAP.

## 2015-01-27 NOTE — Assessment & Plan Note (Signed)
Symptoms slightly worse with taper of Clonazepam. Will continue to monitor.

## 2015-01-27 NOTE — Progress Notes (Signed)
Pre visit review using our clinic review tool, if applicable. No additional management support is needed unless otherwise documented below in the visit note. 

## 2015-01-27 NOTE — Assessment & Plan Note (Addendum)
Right biceps tendonitis, with possible small tear. Discussed conservative management including icing area, limiting lifting and using NSAIDS. If no improvement, we discussed referral to Sports Med. Follow up in 4 weeks.

## 2015-01-27 NOTE — Patient Instructions (Signed)
Labs today.  Follow up for physical in 4 weeks.

## 2015-02-27 ENCOUNTER — Encounter: Payer: No Typology Code available for payment source | Admitting: Internal Medicine

## 2015-03-30 ENCOUNTER — Encounter: Payer: Self-pay | Admitting: Internal Medicine

## 2015-03-30 ENCOUNTER — Ambulatory Visit (INDEPENDENT_AMBULATORY_CARE_PROVIDER_SITE_OTHER): Payer: Managed Care, Other (non HMO) | Admitting: Internal Medicine

## 2015-03-30 ENCOUNTER — Other Ambulatory Visit (HOSPITAL_COMMUNITY)
Admission: RE | Admit: 2015-03-30 | Discharge: 2015-03-30 | Disposition: A | Discharge status: Home / Self Care | Payer: Managed Care, Other (non HMO) | Source: Ambulatory Visit | Attending: Internal Medicine | Admitting: Internal Medicine

## 2015-03-30 VITALS — BP 104/68 | HR 76 | Temp 98.4°F | Resp 14 | Ht 69.5 in | Wt 174.0 lb

## 2015-03-30 DIAGNOSIS — Z0000 Encounter for general adult medical examination without abnormal findings: Secondary | ICD-10-CM

## 2015-03-30 DIAGNOSIS — IMO0002 Reserved for concepts with insufficient information to code with codable children: Secondary | ICD-10-CM

## 2015-03-30 DIAGNOSIS — N920 Excessive and frequent menstruation with regular cycle: Secondary | ICD-10-CM | POA: Diagnosis not present

## 2015-03-30 DIAGNOSIS — R8789 Other abnormal findings in specimens from female genital organs: Secondary | ICD-10-CM | POA: Diagnosis not present

## 2015-03-30 DIAGNOSIS — Z01419 Encounter for gynecological examination (general) (routine) without abnormal findings: Secondary | ICD-10-CM | POA: Insufficient documentation

## 2015-03-30 DIAGNOSIS — Z1151 Encounter for screening for human papillomavirus (HPV): Secondary | ICD-10-CM | POA: Insufficient documentation

## 2015-03-30 DIAGNOSIS — B977 Papillomavirus as the cause of diseases classified elsewhere: Secondary | ICD-10-CM

## 2015-03-30 HISTORY — DX: Encounter for general adult medical examination without abnormal findings: Z00.00

## 2015-03-30 LAB — HM PAP SMEAR: HM PAP: NORMAL

## 2015-03-30 NOTE — Addendum Note (Signed)
Addended by: Karlene Einstein D on: 03/30/2015 02:44 PM   Modules accepted: Orders

## 2015-03-30 NOTE — Progress Notes (Signed)
Subjective:    Patient ID: Kelsey Mcguire, female    DOB: 03-Sep-1981, 34 y.o.   MRN: [id number]  HPI  34YO female presents for physical exam.  Concerned that she may have "hormone imbalance." Symptoms of longer menses, questions if she may have low progesterone. Worsening acne. Concerned about weight gain. Low libido.    Wt Readings from Last 3 Encounters:  03/30/15 174 lb (78.926 kg)  01/27/15 161 lb 4 oz (73.143 kg)  06/20/14 149 lb 4 oz (67.699 kg)     BP Readings from Last 3 Encounters:  03/30/15 104/68  01/27/15 109/73  06/20/14 104/66    Past medical, surgical, family and social history per today's encounter.  Review of Systems  Constitutional: Positive for diaphoresis and fatigue. Negative for fever, chills, appetite change and unexpected weight change.  Eyes: Negative for visual disturbance.  Respiratory: Negative for shortness of breath.   Cardiovascular: Negative for chest pain and leg swelling.  Gastrointestinal: Negative for nausea, vomiting, abdominal pain, diarrhea and constipation.  Endocrine: Positive for heat intolerance.  Genitourinary: Positive for menstrual problem. Negative for dyspareunia.  Musculoskeletal: Negative for myalgias and arthralgias.  Skin: Negative for color change and rash.  Hematological: Negative for adenopathy. Does not bruise/bleed easily.  Psychiatric/Behavioral: Negative for sleep disturbance and dysphoric mood. The patient is not nervous/anxious.        Objective:    BP 104/68 mmHg  Pulse 76  Temp(Src) 98.4 F (36.9 C) (Oral)  Resp 14  Ht 5' 9.5" (1.765 m)  Wt 174 lb (78.926 kg)  BMI 25.34 kg/m2  SpO2 97% Physical Exam  Constitutional: She is oriented to person, place, and time. She appears well-developed and well-nourished. No distress.  HENT:  Head: Normocephalic and atraumatic.  Right Ear: External ear normal.  Left Ear: External ear normal.  Nose: Nose normal.  Mouth/Throat: Oropharynx is clear and moist. No  oropharyngeal exudate.  Eyes: Conjunctivae are normal. Pupils are equal, round, and reactive to light. Right eye exhibits no discharge. Left eye exhibits no discharge. No scleral icterus.  Neck: Normal range of motion. Neck supple. No tracheal deviation present. No thyromegaly present.  Cardiovascular: Normal rate, regular rhythm, normal heart sounds and intact distal pulses.  Exam reveals no gallop and no friction rub.   No murmur heard. Pulmonary/Chest: Effort normal and breath sounds normal. No respiratory distress. She has no wheezes. She has no rales. She exhibits no tenderness.  Abdominal: Soft. Bowel sounds are normal. She exhibits no distension and no mass. There is no tenderness. There is no rebound and no guarding.  Genitourinary: Rectum normal, vagina normal and uterus normal. No breast swelling, tenderness, discharge or bleeding. Pelvic exam was performed with patient supine. There is no rash, tenderness or lesion on the right labia. There is no rash, tenderness or lesion on the left labia. Uterus is not enlarged and not tender. Cervix exhibits discharge (slight brownish). Cervix exhibits no motion tenderness and no friability. Right adnexum displays no mass, no tenderness and no fullness. Left adnexum displays no mass, no tenderness and no fullness. No erythema or tenderness in the vagina. No vaginal discharge found.  Musculoskeletal: Normal range of motion. She exhibits no edema or tenderness.  Lymphadenopathy:    She has no cervical adenopathy.  Neurological: She is alert and oriented to person, place, and time. No cranial nerve deficit. She exhibits normal muscle tone. Coordination normal.  Skin: Skin is warm and dry. No rash noted. She is not diaphoretic. No erythema.  No pallor.  Psychiatric: Her speech is normal and behavior is normal. Judgment and thought content normal. Her mood appears anxious. She exhibits a depressed mood. She expresses no suicidal ideation.            Assessment & Plan:   Problem List Items Addressed This Visit      Unprioritized   Abnormal cervical Pap smear with positive HPV DNA test   Menorrhagia    Recent menorrhagia. Symptoms are suggestive of PCOS. Will set up evaluation with endocrinology. Follow up in 3 months and prn.      Relevant Orders   Ambulatory referral to Endocrinology   Routine general medical examination at a health care facility - Primary    General medical exam normal today except as noted. PAP pending. Encouraged healthy diet and exercise. Reviewed recent labs.           Return in about 3 months (around 06/29/2015) for Recheck.

## 2015-03-30 NOTE — Assessment & Plan Note (Signed)
Recent menorrhagia. Symptoms are suggestive of PCOS. Will set up evaluation with endocrinology. Follow up in 3 months and prn.

## 2015-03-30 NOTE — Assessment & Plan Note (Signed)
General medical exam normal today except as noted. PAP pending. Encouraged healthy diet and exercise. Reviewed recent labs.

## 2015-03-30 NOTE — Progress Notes (Signed)
Pre visit review using our clinic review tool, if applicable. No additional management support is needed unless otherwise documented below in the visit note. 

## 2015-03-30 NOTE — Patient Instructions (Signed)

## 2015-03-31 LAB — CYTOLOGY - PAP

## 2015-04-06 ENCOUNTER — Ambulatory Visit (INDEPENDENT_AMBULATORY_CARE_PROVIDER_SITE_OTHER): Payer: Managed Care, Other (non HMO) | Admitting: Endocrinology

## 2015-04-06 VITALS — BP 112/62 | HR 78 | Resp 12 | Ht 69.5 in | Wt 171.5 lb

## 2015-04-06 DIAGNOSIS — N921 Excessive and frequent menstruation with irregular cycle: Secondary | ICD-10-CM | POA: Diagnosis not present

## 2015-04-06 DIAGNOSIS — L709 Acne, unspecified: Secondary | ICD-10-CM | POA: Diagnosis not present

## 2015-04-06 NOTE — Progress Notes (Signed)
HPI: Kelsey Mcguire is a 34 y.o. female, referred by her PCP,  Jackolyn Confer, MD, for evaluation for "hormonal imbalance" -?PCOS.   Fertility/Menstrual cycles: - menarche at age 7-13 - irregular menses- occuring every  4-5 weeks with menstrual length lasting between 8-14 days - no h/o ovarian cysts - children: 0 - contraception: nothing right now, previously tried several BCP preparations that she didn't tolerate due to weight gain,mood swings, bloating -Plans for pregnancy:"if it happens, it happens"- not actively planning it -No breast discharge  -was told that she had small fibroids, recent VWF testing negative -has anemia from menorrhagia -recently having some night time hot flashes -LMP April 10th, 2016  Hirsutism: -Onset around past several months - Not using any steroids -Noticing terminal hair over chin and chest- very occasional -using manual methods to remove hair:controlled with plucking -Stable  -No hair loss elsewhere  Acne: - noticing around periods- went to derm in the past, was given doxycycline but didn't tolerate. Clindamycin gel not helping either  Weight fluctuates: - about 10-15 lbs in a month - no steroid use - no weight loss meds  -No abnormal stretch marks , always easy bruising -recent thyroid levels normal    Associated history of : - no CAD - No DM or Pre-DM - No hyperlipidemia - No hypothyroidism     Treatments tried: - did not try Metformin - did not try Spironolactone - did not try [place] - not on OCPs now -wants to know if I can run a hormone panel, whether acupuncture would help, whether compounded meds will help  - Last thyroid tests: Lab Results  Component Value Date   TSH 1.92 01/27/2015   FREET4 0.93 01/27/2015    - Last set of lipids:    Component Value Date/Time   CHOL 143 12/20/2013 0908   TRIG 49.0 12/20/2013 0908   HDL 55.40 12/20/2013 0908   CHOLHDL 3 12/20/2013 0908   VLDL 9.8 12/20/2013 0908   LDLCALC 78 12/20/2013 0908    - Last HbA1c: Lab Results  Component Value Date   HGBA1C 5.5 12/03/2012     FH of DM in aunt.  FH of PCOS/hirsutism in none. No FH thyroid. No FH early menopause. I have reviewed the patient's past medical history, family and social history, surgical history, medications and allergies.    Past Medical History  Diagnosis Date  . Migraine   . Sinus infection   . Urinary tract infection   . Tourette syndrome   . IUD complication     spontaneously came out while on honeymoon   Past Surgical History  Procedure Laterality Date  . Wisdom tooth extraction     Family History  Problem Relation Age of Onset  . Depression Mother   . Anxiety disorder Mother   . Heart disease Father   . Hypertension Father   . Diabetes Father   . Tourette syndrome Father    History   Social History  . Marital Status: Single    Spouse Name: N/A  . Number of Children: N/A  . Years of Education: N/A   Occupational History  . Not on file.   Social History Main Topics  . Smoking status: Former Research scientist (life sciences)  . Smokeless tobacco: Current User     Comment: E-cigarette  . Alcohol Use: No  . Drug Use: Not on file  . Sexual Activity: Not on file   Other Topics Concern  . Not on file   Social History Narrative  Lives in [place].      Works - [employer]   Current Outpatient Prescriptions on File Prior to Visit  Medication Sig Dispense Refill  . ferrous fumarate (HEMOCYTE - 106 MG FE) 325 (106 FE) MG TABS Take 1 tablet by mouth. Take one to three a day    . Melatonin 5 MG TABS Take 1 tablet by mouth daily.    Marland Kitchen zolpidem (AMBIEN) 10 MG tablet Take 10 mg by mouth at bedtime as needed.     No current facility-administered medications on file prior to visit.   Allergies  Allergen Reactions  . Augmentin [Amoxicillin-Pot Clavulanate] Nausea Only    Review of Systems: [x]  complains of  [  ] denies General:   [ x ] Recent weight change  [ x ] Fatigue  [  ] Loss of appetite Eyes: [  ]  Vision Difficulty [  ]  Eye pain ENT: [  ]  Hearing difficulty [  ]  Difficulty Swallowing CVS: [  ] Chest pain [  ]  Palpitations/Irregular Heart beat [  ]  Shortness of breath lying flat [  ] Swelling of legs Resp: [  ] Frequent Cough [  ] Shortness of Breath  [  ]  Wheezing GI: [  ] Heartburn  [  ] Nausea or Vomiting  [  ] Diarrhea [  ] Constipation  [ x ] Abdominal Pain GU: [  ]  Polyuria  [  ]  nocturia Bones/joints:  [  ]  Muscle aches  [  ] Joint Pain  [  ] Bone pain Skin/Hair/Nails: [  ]  Rash  [  ] New stretch marks [  ]  Itching [  ] Hair loss [  ]  Excessive hair growth Reproduction: [ x ] Low sexual desire , [ x ]  Women: Menstrual cycle problems [  ]  Women: Breast Discharge [  ] Men: Difficulty with erections [  ]  Men: Enlarged Breasts CNS: [ x ] Frequent Headaches [  ] Blurry vision [  ] Tremors [  ] Seizures [  ] Loss of consciousness [  ] Localized weakness Endocrine: [  ]  Excess thirst [  ]  Feeling excessively hot [ x ]  Feeling excessively cold Heme: [x  ]  Easy bruising [  ]  Enlarged glands or lumps in neck Allergy: [  ]  Food allergies [  x] Environmental allergies    PE: BP 112/62 mmHg  Pulse 78  Resp 12  Ht 5' 9.5" (1.765 m)  Wt 171 lb 8 oz (77.792 kg)  BMI 24.97 kg/m2  SpO2 97% Wt Readings from Last 3 Encounters:  04/06/15 171 lb 8 oz (77.792 kg)  03/30/15 174 lb (78.926 kg)  01/27/15 161 lb 4 oz (73.143 kg)   Body mass index is 24.97 kg/(m^2).  Constitutional: pleasant, in NAD, no full supraclavicular fat pads Eyes: PERRLA, EOMI, no exophthalmos ENT: moist mucous membranes, no thyromegaly, no cervical lymphadenopathy Cardiovascular: RRR, No MRG Respiratory: CTA B Gastrointestinal: abdomen soft, NT, ND, BS+, no abnormal straie, no midline hair Musculoskeletal: no deformities, strength intact in all 4 Skin: moist, warm; + acne on face, - dark terminal hair on chin, - vellum on sideburns, - skin tags, -  acanthosis nigricans, no purple, wide, stretch marks Neurological: no tremor with outstretched hands, DTR normal in all 4  ASSESSMENT: 1. ?PCOS 2. ?menorrhagia due to  local causes  Problem List Items Addressed This Visit      Musculoskeletal and Integument   Acne    Now using OTC gel and daily facial cleansing routine.  Recently went for acupuncture treatments for overall anxiety and stress      Relevant Orders   FSH/LH   Estradiol     Other   Menorrhagia - Primary    Could be secondary to fibroids or due to her usual menstrual pattern. PCOS is on the DDx but low probability. However , given the reported hx hirsutism and acne during cycles, will screen for FSH/LH/E2 and Testosterone levels. Her menses are fairly regular, and local symptoms are tolerable. If Testosterone levels elevated, could consider Spirinolactone for treatment. Recent glucose okay, no previous hx of DM/preDM.  Is not particularly interested in fertility at this time, and I have advised her to get checked with her GYN for the possibility of fibroids contributing to her menstrual symptoms and whether depot progesterone might be better for her symptom control, as has not tolerated several BCPs in the past.  Advised her against use of compounded treatments, holistic treatment.  As far as acupuncture, I am not aware of any studies that help. As long as the needles are sterile, she can go ahead with these treatments if she wishes.       Relevant Orders   FSH/LH   Estradiol   Testosterone, Total, LC/MS       RTC 3 months  Shafer Swamy [facility]  04/06/2015 1:40 PM

## 2015-04-06 NOTE — Patient Instructions (Signed)
Labs today.  Further treatment plan to be based on labs.  Follow up with GYN for consideration of depoprogesterone.   Please come back for a follow-up appointment in 3 months

## 2015-04-06 NOTE — Progress Notes (Signed)
Pre visit review using our clinic review tool, if applicable. No additional management support is needed unless otherwise documented below in the visit note. 

## 2015-04-06 NOTE — Assessment & Plan Note (Signed)
Could be secondary to fibroids or due to her usual menstrual pattern. PCOS is on the DDx but low probability. However , given the reported hx hirsutism and acne during cycles, will screen for FSH/LH/E2 and Testosterone levels. Her menses are fairly regular, and local symptoms are tolerable. If Testosterone levels elevated, could consider Spirinolactone for treatment. Recent glucose okay, no previous hx of DM/preDM.  Is not particularly interested in fertility at this time, and I have advised her to get checked with her GYN for the possibility of fibroids contributing to her menstrual symptoms and whether depot progesterone might be better for her symptom control, as has not tolerated several BCPs in the past.  Advised her against use of compounded treatments, holistic treatment.  As far as acupuncture, I am not aware of any studies that help. As long as the needles are sterile, she can go ahead with these treatments if she wishes.

## 2015-04-06 NOTE — Assessment & Plan Note (Signed)
Now using OTC gel and daily facial cleansing routine.  Recently went for acupuncture treatments for overall anxiety and stress

## 2015-04-07 LAB — FSH/LH
FSH: 6.7 m[IU]/mL
LH: 11.2 m[IU]/mL

## 2015-04-07 LAB — ESTRADIOL: ESTRADIOL: 70.3 pg/mL

## 2015-04-08 LAB — TESTOSTERONE, TOTAL, LC/MS: Testosterone, total: 27.9 ng/dL (ref 10.0–55.0)

## 2015-04-11 ENCOUNTER — Encounter: Payer: Self-pay | Admitting: *Deleted

## 2015-06-02 ENCOUNTER — Encounter: Payer: Self-pay | Admitting: Internal Medicine

## 2015-06-02 ENCOUNTER — Ambulatory Visit: Payer: Self-pay | Admitting: Internal Medicine

## 2015-06-02 ENCOUNTER — Ambulatory Visit
Admission: RE | Admit: 2015-06-02 | Discharge: 2015-06-02 | Disposition: A | Discharge status: Home / Self Care | Payer: Managed Care, Other (non HMO) | Source: Ambulatory Visit | Attending: Internal Medicine | Admitting: Internal Medicine

## 2015-06-02 ENCOUNTER — Telehealth: Payer: Self-pay | Admitting: Internal Medicine

## 2015-06-02 ENCOUNTER — Ambulatory Visit (INDEPENDENT_AMBULATORY_CARE_PROVIDER_SITE_OTHER): Payer: Managed Care, Other (non HMO) | Admitting: Internal Medicine

## 2015-06-02 VITALS — BP 107/70 | HR 69 | Temp 98.5°F | Ht 69.5 in | Wt 179.4 lb

## 2015-06-02 DIAGNOSIS — R109 Unspecified abdominal pain: Secondary | ICD-10-CM

## 2015-06-02 LAB — CBC WITH DIFFERENTIAL/PLATELET
Basophils Absolute: 0 10*3/uL (ref 0.0–0.1)
Basophils Relative: 0.6 % (ref 0.0–3.0)
EOS ABS: 0.2 10*3/uL (ref 0.0–0.7)
EOS PCT: 2.5 % (ref 0.0–5.0)
HCT: 42.4 % (ref 36.0–46.0)
Hemoglobin: 14.4 g/dL (ref 12.0–15.0)
Lymphocytes Relative: 28.8 % (ref 12.0–46.0)
Lymphs Abs: 1.8 10*3/uL (ref 0.7–4.0)
MCHC: 34 g/dL (ref 30.0–36.0)
MCV: 90.3 fl (ref 78.0–100.0)
MONO ABS: 0.3 10*3/uL (ref 0.1–1.0)
Monocytes Relative: 5.5 % (ref 3.0–12.0)
NEUTROS ABS: 3.8 10*3/uL (ref 1.4–7.7)
Neutrophils Relative %: 62.6 % (ref 43.0–77.0)
Platelets: 267 10*3/uL (ref 150.0–400.0)
RBC: 4.69 Mil/uL (ref 3.87–5.11)
RDW: 12.7 % (ref 11.5–15.5)
WBC: 6.1 10*3/uL (ref 4.0–10.5)

## 2015-06-02 LAB — POCT URINALYSIS DIPSTICK
BILIRUBIN UA: NEGATIVE
GLUCOSE UA: NEGATIVE
Ketones, UA: NEGATIVE
Nitrite, UA: NEGATIVE
PROTEIN UA: NEGATIVE
SPEC GRAV UA: 1.02
Urobilinogen, UA: 0.2
pH, UA: 7

## 2015-06-02 LAB — COMPREHENSIVE METABOLIC PANEL
ALT: 17 U/L (ref 0–35)
AST: 17 U/L (ref 0–37)
Albumin: 4.2 g/dL (ref 3.5–5.2)
Alkaline Phosphatase: 57 U/L (ref 39–117)
BUN: 9 mg/dL (ref 6–23)
CO2: 28 meq/L (ref 19–32)
Calcium: 9.2 mg/dL (ref 8.4–10.5)
Chloride: 103 mEq/L (ref 96–112)
Creatinine, Ser: 0.63 mg/dL (ref 0.40–1.20)
GFR: 114.83 mL/min (ref >60.00)
GLUCOSE: 100 mg/dL — AB (ref 70–99)
POTASSIUM: 4.3 meq/L (ref 3.5–5.1)
Sodium: 137 mEq/L (ref 135–145)
Total Bilirubin: 0.5 mg/dL (ref 0.2–1.2)
Total Protein: 7.2 g/dL (ref 6.0–8.3)

## 2015-06-02 LAB — POCT URINE PREGNANCY: PREG TEST UR: NEGATIVE

## 2015-06-02 MED ORDER — IOHEXOL 350 MG/ML SOLN
100.0000 mL | Freq: Once | INTRAVENOUS | Status: AC | PRN
  Administered 2015-06-02: 100 mL via INTRAVENOUS

## 2015-06-02 NOTE — Addendum Note (Signed)
Addended by: Ronette Deter A on: 06/02/2015 01:33 PM   Modules accepted: Orders

## 2015-06-02 NOTE — Telephone Encounter (Signed)
FYI

## 2015-06-02 NOTE — Addendum Note (Signed)
Addended by: Karlene Einstein D on: 06/02/2015 02:03 PM   Modules accepted: Orders

## 2015-06-02 NOTE — Progress Notes (Signed)
Pre visit review using our clinic review tool, if applicable. No additional management support is needed unless otherwise documented below in the visit note. 

## 2015-06-02 NOTE — Progress Notes (Signed)
Subjective:    Patient ID: Kelsey Mcguire, female    DOB: 1981-07-15, 33 y.o.   MRN: [id number]  HPI  34YO female presents for acute visit.  Abdominal pain - Three weeks ago, woke with diffuse lower abdominal pain. Was having menses at this time. No NVD. No fever. Wednesday morning, woke again with lower right sided abdominal pain. Bad enough to stay home from work. Pain continued for about 7 hours, then improved. Started spotting later in day. Took Ibuprofen first episode with some improvement. Described as "dull and sharp at same time."  Described as severe at times. Also has some pain that radiates to back. Feels more "bloated" over the last month.  Past medical, surgical, family and social history per today's encounter.  Review of Systems  Constitutional: Negative for fever, chills, appetite change, fatigue and unexpected weight change.  Eyes: Negative for visual disturbance.  Respiratory: Negative for shortness of breath.   Cardiovascular: Negative for chest pain, palpitations and leg swelling.  Gastrointestinal: Positive for abdominal pain and abdominal distention. Negative for nausea, vomiting, diarrhea and constipation.  Skin: Negative for color change and rash.  Hematological: Negative for adenopathy. Does not bruise/bleed easily.  Psychiatric/Behavioral: Negative for dysphoric mood. The patient is not nervous/anxious.        Objective:    BP 107/70 mmHg  Pulse 69  Temp(Src) 98.5 F (36.9 C) (Oral)  Ht 5' 9.5" (1.765 m)  Wt 179 lb 6 oz (81.364 kg)  BMI 26.12 kg/m2  SpO2 100%  LMP 05/12/2015 Physical Exam  Constitutional: She is oriented to person, place, and time. She appears well-developed and well-nourished. No distress.  HENT:  Head: Normocephalic and atraumatic.  Right Ear: External ear normal.  Left Ear: External ear normal.  Nose: Nose normal.  Mouth/Throat: Oropharynx is clear and moist.  Eyes: Conjunctivae and EOM are normal. Pupils are equal, round,  and reactive to light. Right eye exhibits no discharge.  Neck: Normal range of motion. Neck supple. No thyromegaly present.  Cardiovascular: Normal rate, regular rhythm, normal heart sounds and intact distal pulses.  Exam reveals no gallop and no friction rub.   No murmur heard. Pulmonary/Chest: Effort normal. No respiratory distress. She has no wheezes. She has no rales.  Abdominal: Soft. Normal appearance and bowel sounds are normal. She exhibits no distension and no mass. There is no hepatosplenomegaly. There is tenderness in the right lower quadrant. There is tenderness at McBurney's point. There is no rebound and no guarding.  Musculoskeletal: Normal range of motion. She exhibits no edema or tenderness.  Lymphadenopathy:    She has no cervical adenopathy.  Neurological: She is alert and oriented to person, place, and time. No cranial nerve deficit. Coordination normal.  Skin: Skin is warm and dry. No rash noted. She is not diaphoretic. No erythema. No pallor.  Psychiatric: She has a normal mood and affect. Her behavior is normal. Judgment and thought content normal.          Assessment & Plan:   Problem List Items Addressed This Visit      Unprioritized   Abdominal pain - Primary    RLQ abdominal pain with point tenderness on exam at McBurney's point. Will get CT abdomen and pelvis. Check CBC, CMP, urinalysis, urine pregnancy. Discussed potential alternative diagnoses including ectopic pregnancy, endometriosis, nephrolithiasis.      Relevant Orders   CBC w/Diff   Comprehensive metabolic panel   B-HCG Quant   POCT Urinalysis Dipstick   CT Abdomen  Pelvis W Contrast   POCT urine pregnancy       Return for Recheck.

## 2015-06-02 NOTE — Assessment & Plan Note (Signed)
RLQ abdominal pain with point tenderness on exam at McBurney's point. Will get CT abdomen and pelvis. Check CBC, CMP, urinalysis, urine pregnancy. Discussed potential alternative diagnoses including ectopic pregnancy, endometriosis, nephrolithiasis.

## 2015-06-02 NOTE — Telephone Encounter (Signed)
Patient Name: Kelsey Mcguire DOB: 1981-02-05 Initial Comment caller states she has abdominal pain Nurse Assessment Nurse: Vallery Sa, RN, Cathy Date/Time (Eastern Time): 06/02/2015 8:44:51 AM Confirm and document reason for call. If symptomatic, describe symptoms. ---Caller states that she has had lower, right abdominal pain on and off since 05/14/15. No injury in the past 3 days. No fever. Has the patient traveled out of the country within the last 30 days? ---No Does the patient require triage? ---Yes Related visit to physician within the last 2 weeks? ---No Does the PT have any chronic conditions? (i.e. diabetes, asthma, etc.) ---Yes List chronic conditions. ---Insomnia, Anxiety, Depression, Irregular menstrual Cycle, Fibroids, Cysts on ovaries Did the patient indicate they were pregnant? ---No Guidelines Guideline Title Affirmed Question Affirmed Notes Abdominal Pain - Female [1] MODERATE (e.g., interferes with normal activities) AND [2] pain comes and goes (cramps) AND [3] present > 24 hours (Exception: pain with Vomiting or Diarrhea - see that Guideline) Final Disposition User See Physician within [place], RN, Tye [place] Comments Scheduled for 12:15pm appointment today with Dr. Worthy Keeler at the [facility] office.

## 2015-06-02 NOTE — Patient Instructions (Signed)
Labs today.  CT abdomen and pelvis to evaluate for appendicitis.

## 2015-06-05 ENCOUNTER — Encounter: Payer: Self-pay | Admitting: Internal Medicine

## 2015-06-05 LAB — HCG, QUANTITATIVE, PREGNANCY: Quantitative HCG: 0.02 m[IU]/mL

## 2015-06-05 LAB — URINE CULTURE

## 2015-06-29 ENCOUNTER — Encounter: Payer: Self-pay | Admitting: Internal Medicine

## 2015-06-29 ENCOUNTER — Ambulatory Visit: Payer: Managed Care, Other (non HMO) | Admitting: Endocrinology

## 2015-06-29 ENCOUNTER — Ambulatory Visit (INDEPENDENT_AMBULATORY_CARE_PROVIDER_SITE_OTHER): Payer: Managed Care, Other (non HMO) | Admitting: Internal Medicine

## 2015-06-29 VITALS — BP 103/66 | HR 77 | Temp 98.6°F | Ht 69.5 in | Wt 186.5 lb

## 2015-06-29 DIAGNOSIS — N921 Excessive and frequent menstruation with irregular cycle: Secondary | ICD-10-CM | POA: Diagnosis not present

## 2015-06-29 DIAGNOSIS — F32A Depression, unspecified: Secondary | ICD-10-CM | POA: Insufficient documentation

## 2015-06-29 DIAGNOSIS — R109 Unspecified abdominal pain: Secondary | ICD-10-CM | POA: Diagnosis not present

## 2015-06-29 DIAGNOSIS — F329 Major depressive disorder, single episode, unspecified: Secondary | ICD-10-CM | POA: Diagnosis not present

## 2015-06-29 NOTE — Patient Instructions (Signed)
We will set up an evaluation with Dr. Garwin Brothers in OB/GYN.

## 2015-06-29 NOTE — Progress Notes (Signed)
Pre visit review using our clinic review tool, if applicable. No additional management support is needed unless otherwise documented below in the visit note. 

## 2015-06-29 NOTE — Progress Notes (Signed)
Subjective:    Patient ID: Kelsey Mcguire, female    DOB: May 12, 1981, 34 y.o.   MRN: [id number]  HPI  34YO female presents for follow up.  Seen 6/24 for abdominal pain. CT abdomen was normal.  Pain has improved. Concerned that menses are becoming closer together. Having menses every 13 days. Menses lasts for about 14 days. Occasional spotting in between cycles. Spotting may last 5-6 days. Does not have a GYN at present.  Depression - Started on Viibryd by psychiatrist. Having some nausea with this medication. No change in depressed mood. Taking for 10 days.  Past medical, surgical, family and social history per today's encounter.  Review of Systems  Constitutional: Negative for fever, chills, appetite change, fatigue and unexpected weight change.  Eyes: Negative for visual disturbance.  Respiratory: Negative for shortness of breath.   Cardiovascular: Negative for chest pain and leg swelling.  Gastrointestinal: Negative for nausea, vomiting, abdominal pain, diarrhea and constipation.  Genitourinary: Positive for menstrual problem.  Skin: Negative for color change and rash.  Hematological: Negative for adenopathy. Does not bruise/bleed easily.  Psychiatric/Behavioral: Positive for dysphoric mood. Negative for sleep disturbance. The patient is not nervous/anxious.        Objective:    BP 103/66 mmHg  Pulse 77  Temp(Src) 98.6 F (37 C) (Oral)  Ht 5' 9.5" (1.765 m)  Wt 186 lb 8 oz (84.596 kg)  BMI 27.16 kg/m2  SpO2 96%  LMP 06/27/2015 Physical Exam  Constitutional: She is oriented to person, place, and time. She appears well-developed and well-nourished. No distress.  HENT:  Head: Normocephalic and atraumatic.  Right Ear: External ear normal.  Left Ear: External ear normal.  Nose: Nose normal.  Mouth/Throat: Oropharynx is clear and moist. No oropharyngeal exudate.  Eyes: Conjunctivae are normal. Pupils are equal, round, and reactive to light. Right eye exhibits no  discharge. Left eye exhibits no discharge. No scleral icterus.  Neck: Normal range of motion. Neck supple. No tracheal deviation present. No thyromegaly present.  Cardiovascular: Normal rate, regular rhythm, normal heart sounds and intact distal pulses.  Exam reveals no gallop and no friction rub.   No murmur heard. Pulmonary/Chest: Effort normal and breath sounds normal. No respiratory distress. She has no wheezes. She has no rales. She exhibits no tenderness.  Abdominal: Soft. Bowel sounds are normal. She exhibits no distension and no mass. There is tenderness (mild diffuse, much improved compared to previous). There is no rebound and no guarding.  Musculoskeletal: Normal range of motion. She exhibits no edema or tenderness.  Lymphadenopathy:    She has no cervical adenopathy.  Neurological: She is alert and oriented to person, place, and time. No cranial nerve deficit. She exhibits normal muscle tone. Coordination normal.  Skin: Skin is warm and dry. No rash noted. She is not diaphoretic. No erythema. No pallor.  Psychiatric: She has a normal mood and affect. Her behavior is normal. Judgment and thought content normal.          Assessment & Plan:   Problem List Items Addressed This Visit      Unprioritized   Abdominal pain    Symptoms improved, likely related to irregular menses. Question if she may have endometriosis, however CT was unrevealing. GYN referral placed.      Depression    Worsening depression. Started on Viibryd. Discussed potential side effects of this medication. Encouraged her to follow up with her psychiatrist if symptoms are not improving.      Relevant Medications  Vilazodone HCl (VIIBRYD) 20 MG TABS   Menorrhagia - Primary    Persistent menorrhagia with irregular cycle. Will set up GYN evaluation.       Relevant Orders   Ambulatory referral to Gynecology       Return in about 4 weeks (around 07/27/2015) for Recheck.

## 2015-06-29 NOTE — Assessment & Plan Note (Signed)
Persistent menorrhagia with irregular cycle. Will set up GYN evaluation.

## 2015-06-29 NOTE — Assessment & Plan Note (Signed)
Symptoms improved, likely related to irregular menses. Question if she may have endometriosis, however CT was unrevealing. GYN referral placed.

## 2015-06-29 NOTE — Assessment & Plan Note (Signed)
Worsening depression. Started on Viibryd. Discussed potential side effects of this medication. Encouraged her to follow up with her psychiatrist if symptoms are not improving.

## 2015-07-27 ENCOUNTER — Ambulatory Visit: Payer: Managed Care, Other (non HMO) | Admitting: Internal Medicine

## 2015-12-10 NOTE — L&D Delivery Note (Signed)
Delivery Note At 9:30 PM a viable and healthy female was delivered via Vaginal, Spontaneous Delivery (Presentation:OA ;  vtx).  APGAR: 8, 9; weight 7 lb 0.5 oz (3190 g).   Placenta status: spontaneous, intact ,not sent .  Cord: CAN x 1 reducible  with the following complications: .  Cord pH: none  Anesthesia:  epidural Episiotomy: None Lacerations: right vaginal sulcus Suture Repair: 3.0 chromic Est. Blood Loss (mL): over 1000  Mom to postpartum.  Baby to Couplet care / Skin to Skin.  Dora Simeone A 08/02/2016, 6:04 AM  Pt became complete and some clots noted. Pushed well with subsequent delivery as noted above. Placenta came out uneventful, intact. Steady flow of blood noted. Bimanual massage. Clots removed. Continued flow noted.  Straight cath for 50 cc urine. Cervix inspected circumferentially. No laceration noted.  Uterine cavity manually explored. No retained tissue. Clots removed. Uterus firm. 800 mcg of cytotec placed rectally. Continued bimanual massage. IM pitocin along with IV pitocin given. Pt c/o feeling cold. Multiple vs taken. Nl pulse. BP drifted down. Stat cbc done as well as DIC panel when blood and fluid from vagina was serous red watery? DIC. Foley catheter placed. 2nd IV line placed. Magnesium placed on pause. hgb 9.9. plt 259K.  Labs not c/w clinical picture. Bear hugger placed on pt. C/o thirst./dry.  DIc panel returns ; nl fibrinogen. elev D-dimer. plt nl.  Lowered BP to systolic [id number] resulted in the decision to proceed with blood transfusion despite the lab. Repeat CBC done. hgb 8.3. PPH protocol in place. Consent for transfusion. 2 U PRBC given. Pt feels better. Vaginal bleeding abated. Urine was clear. IV F bolus given tempered by pt underlying preeclampsia. Pt felt better. Facial swelling noted from fluid volume. Empiric IV lasix 20mg  given. Resume magnesium

## 2015-12-13 ENCOUNTER — Telehealth: Payer: Self-pay | Admitting: *Deleted

## 2015-12-13 ENCOUNTER — Encounter: Payer: Self-pay | Admitting: Internal Medicine

## 2015-12-13 ENCOUNTER — Ambulatory Visit (INDEPENDENT_AMBULATORY_CARE_PROVIDER_SITE_OTHER): Payer: Managed Care, Other (non HMO) | Admitting: Internal Medicine

## 2015-12-13 VITALS — BP 113/73 | HR 86 | Temp 98.6°F | Wt 181.0 lb

## 2015-12-13 DIAGNOSIS — Z331 Pregnant state, incidental: Secondary | ICD-10-CM

## 2015-12-13 DIAGNOSIS — Z23 Encounter for immunization: Secondary | ICD-10-CM

## 2015-12-13 DIAGNOSIS — Z3490 Encounter for supervision of normal pregnancy, unspecified, unspecified trimester: Secondary | ICD-10-CM | POA: Insufficient documentation

## 2015-12-13 LAB — HCG, QUANTITATIVE, PREGNANCY: QUANTITATIVE HCG: 13709 m[IU]/mL

## 2015-12-13 MED ORDER — PRENATAL VITAMINS PLUS 27-1 MG PO TABS: 1.0000 | ORAL_TABLET | Freq: Every day | ORAL | Status: DC

## 2015-12-13 NOTE — Progress Notes (Signed)
Subjective:    Patient ID: Kelsey Mcguire, female    DOB: [birth year], 35 y.o.   MRN: [id number]  HPI  35YO female presents for acute visit.  Went to see Dr. Garwin Brothers. Started on Prozac. Missed menses this week. Felt nauseous yesterday. Took 3 pregnancy tests. All were positive. Notes feeling tired. Had some slight cramping which she thought was onset of her menses over Christmas but this subsided. No vaginal bleeding.  Wt Readings from Last 3 Encounters:  12/13/15 181 lb (82.101 kg)  06/29/15 186 lb 8 oz (84.596 kg)  06/02/15 179 lb 6 oz (81.364 kg)   BP Readings from Last 3 Encounters:  12/13/15 113/73  06/29/15 103/66  06/02/15 107/70    Past Medical History  Diagnosis Date  . Migraine   . Sinus infection   . Urinary tract infection   . Tourette syndrome   . IUD complication ([place])     spontaneously came out while on honeymoon   Family History  Problem Relation Age of Onset  . Depression Mother   . Anxiety disorder Mother   . Heart disease Father   . Hypertension Father   . Diabetes Father   . Tourette syndrome Father    Past Surgical History  Procedure Laterality Date  . Wisdom tooth extraction     Social History   Social History  . Marital Status: Single    Spouse Name: N/A  . Number of Children: N/A  . Years of Education: N/A   Social History Main Topics  . Smoking status: Former Research scientist (life sciences)  . Smokeless tobacco: Current User     Comment: E-cigarette  . Alcohol Use: No  . Drug Use: None  . Sexual Activity: Not Asked   Other Topics Concern  . None   Social History Narrative   Lives in [place].      Works - [employer]    Review of Systems  Constitutional: Positive for fatigue. Negative for fever, chills, appetite change and unexpected weight change.  Eyes: Negative for visual disturbance.  Respiratory: Negative for shortness of breath.   Cardiovascular: Negative for chest pain and leg swelling.    Gastrointestinal: Negative for nausea, vomiting, abdominal pain, diarrhea and constipation.  Genitourinary: Negative for menstrual problem.  Skin: Negative for color change and rash.  Hematological: Negative for adenopathy. Does not bruise/bleed easily.  Psychiatric/Behavioral: Positive for sleep disturbance. Negative for dysphoric mood. The patient is not nervous/anxious.        Objective:    BP 113/73 mmHg  Pulse 86  Temp(Src) 98.6 F (37 C) (Oral)  Wt 181 lb (82.101 kg)  SpO2 99%  LMP 11/07/2015 (Exact Date) Physical Exam  Constitutional: She is oriented to person, place, and time. She appears well-developed and well-nourished. No distress.  HENT:  Head: Normocephalic and atraumatic.  Right Ear: External ear normal.  Left Ear: External ear normal.  Nose: Nose normal.  Mouth/Throat: Oropharynx is clear and moist. No oropharyngeal exudate.  Eyes: Conjunctivae are normal. Pupils are equal, round, and reactive to light. Right eye exhibits no discharge. Left eye exhibits no discharge. No scleral icterus.  Neck: Normal range of motion. Neck supple. No tracheal deviation present. No thyromegaly present.  Cardiovascular: Normal rate, regular rhythm, normal heart sounds and intact distal pulses.  Exam reveals no gallop and no friction rub.   No murmur heard. Pulmonary/Chest: Effort normal and breath sounds normal. No respiratory distress. She has no wheezes. She  has no rales. She exhibits no tenderness.  Musculoskeletal: Normal range of motion. She exhibits no edema or tenderness.  Lymphadenopathy:    She has no cervical adenopathy.  Neurological: She is alert and oriented to person, place, and time. No cranial nerve deficit. She exhibits normal muscle tone. Coordination normal.  Skin: Skin is warm and dry. No rash noted. She is not diaphoretic. No erythema. No pallor.  Psychiatric: She has a normal mood and affect. Her behavior is normal. Judgment and thought content normal.           Assessment & Plan:  Over 1min of which >50% spent in face-to-face contact with patient discussing plan of care for her pregnancy.  Problem List Items Addressed This Visit      Unprioritized   Pregnancy at early stage - Primary    Reviewed some basic guidelines for pregnancy, including medication use, food consumption and exercise. Flu vaccine today. Serum HCG today. Start Prenatal vitamin. Referral placed to OB, Dr. Garwin Brothers.      Relevant Orders   hCG, quantitative, pregnancy   Ambulatory referral to Obstetrics / Gynecology       Return if symptoms worsen or fail to improve.

## 2015-12-13 NOTE — Assessment & Plan Note (Signed)
Reviewed some basic guidelines for pregnancy, including medication use, food consumption and exercise. Flu vaccine today. Serum HCG today. Start Prenatal vitamin. Referral placed to OB, Dr. Garwin Brothers.

## 2015-12-13 NOTE — Telephone Encounter (Signed)
We can work her in at 1pm today for 46min if open. Yes, she should stop Ambien. She can continue Prozac until her visit.

## 2015-12-13 NOTE — Patient Instructions (Addendum)
Stop Ambien. Stop Fluoxetine. We will set up evaluation with Dr. Garwin Brothers.  Commonly Asked Questions During Pregnancy  Cats: A parasite can be excreted in cat feces.  To avoid exposure you need to have another person empty the little box.  If you must empty the litter box you will need to wear gloves.  Wash your hands after handling your cat.  This parasite can also be found in raw or undercooked meat so this should also be avoided.  Colds, Sore Throats, Flu: Please check your medication sheet to see what you can take for symptoms.  If your symptoms are unrelieved by these medications please call the office.  Dental Work: Most any dental work Investment banker, corporate recommends is permitted.  X-rays should only be taken during the first trimester if absolutely necessary.  Your abdomen should be shielded with a lead apron during all x-rays.  Please notify your provider prior to receiving any x-rays.  Novocaine is fine; gas is not recommended.  If your dentist requires a note from [place] prior to dental work please call the office and we will provide one for you.  Exercise: Exercise is an important part of staying healthy during your pregnancy.  You may continue most exercises you were accustomed to prior to pregnancy.  Later in your pregnancy you will most likely notice you have difficulty with activities requiring balance like riding a bicycle.  It is important that you listen to your body and avoid activities that put you at a higher risk of falling.  Adequate rest and staying well hydrated are a must!  If you have questions about the safety of specific activities ask your provider.    Exposure to Children with illness: Try to avoid obvious exposure; report any symptoms to [place] when noted,  If you have chicken pos, red measles or mumps, you should be immune to these diseases.   Please do not take any vaccines while pregnant unless you have checked with your OB provider.  Fetal Movement: After 28 weeks we recommend you  do "kick counts" twice daily.  Lie or sit down in a calm quiet environment and count your baby movements "kicks".  You should feel your baby at least 10 times per hour.  If you have not felt 10 kicks within the first hour get up, walk around and have something sweet to eat or drink then repeat for an additional hour.  If count remains less than 10 per hour notify your provider.  Fumigating: Follow your pest control agent's advice as to how long to stay out of your home.  Ventilate the area well before re-entering.  Hemorrhoids:   Most over-the-counter preparations can be used during pregnancy.  Check your medication to see what is safe to use.  It is important to use a stool softener or fiber in your diet and to drink lots of liquids.  If hemorrhoids seem to be getting worse please call the office.   Hot Tubs:  Hot tubs Jacuzzis and saunas are not recommended while pregnant.  These increase your internal body temperature and should be avoided.  Intercourse:  Sexual intercourse is safe during pregnancy as long as you are comfortable, unless otherwise advised by your provider.  Spotting may occur after intercourse; report any bright red bleeding that is heavier than spotting.  Labor:  If you know that you are in labor, please go to the hospital.  If you are unsure, please call the office and let us help you decide  what to do.  Lifting, straining, etc:  If your job requires heavy lifting or straining please check with your provider for any limitations.  Generally, you should not lift items heavier than that you can lift simply with your hands and arms (no back muscles)  Painting:  Paint fumes do not harm your pregnancy, but may make you ill and should be avoided if possible.  Latex or water based paints have less odor than oils.  Use adequate ventilation while painting.  Permanents & Hair Color:  Chemicals in hair dyes are not recommended as they cause increase hair dryness which can increase hair loss  during pregnancy.  " Highlighting" and permanents are allowed.  Dye may be absorbed differently and permanents may not hold as well during pregnancy.  Sunbathing:  Use a sunscreen, as skin burns easily during pregnancy.  Drink plenty of fluids; avoid over heating.  Tanning Beds:  Because their possible side effects are still unknown, tanning beds are not recommended.  Ultrasound Scans:  Routine ultrasounds are performed at approximately 20 weeks.  You will be able to see your baby's general anatomy an if you would like to know the gender this can usually be determined as well.  If it is questionable when you conceived you may also receive an ultrasound early in your pregnancy for dating purposes.  Otherwise ultrasound exams are not routinely performed unless there is a medical necessity.  Although you can request a scan we ask that you pay for it when conducted because insurance does not cover " patient request" scans.  Work: If your pregnancy proceeds without complications you may work until your due date, unless your physician or employer advises otherwise.  Round Ligament Pain/Pelvic Discomfort:  Sharp, shooting pains not associated with bleeding are fairly common, usually occurring in the second trimester of pregnancy.  They tend to be worse when standing up or when you remain standing for long periods of time.  These are the result of pressure of certain pelvic ligaments called "round ligaments".  Rest, Tylenol and heat seem to be the most effective relief.  As the womb and fetus grow, they rise out of the pelvis and the discomfort improves.  Please notify the office if your pain seems different than that described.  It may represent a more serious condition.

## 2015-12-13 NOTE — Telephone Encounter (Signed)
Please advise? She should stop correct? You want her to come do a HCG test?

## 2015-12-13 NOTE — Telephone Encounter (Signed)
Patient has concerns about her possibly being pregnant. She took three pregnancy test that resulted positive, she quested if she should continue her Prozac and Ambien until her scheduled appt on 12/15/15. Please advise.

## 2015-12-13 NOTE — Progress Notes (Signed)
Pre visit review using our clinic review tool, if applicable. No additional management support is needed unless otherwise documented below in the visit note. 

## 2015-12-13 NOTE — Telephone Encounter (Signed)
Spoke with the patient, verbalized to stop Kelsey Mcguire, will see you today at 1pm

## 2015-12-14 ENCOUNTER — Other Ambulatory Visit: Payer: Managed Care, Other (non HMO)

## 2015-12-14 DIAGNOSIS — Z331 Pregnant state, incidental: Secondary | ICD-10-CM

## 2015-12-14 NOTE — Addendum Note (Signed)
Addended by: Karlene Einstein D on: 12/14/2015 07:58 AM   Modules accepted: Orders

## 2015-12-15 ENCOUNTER — Ambulatory Visit: Payer: Managed Care, Other (non HMO) | Admitting: Internal Medicine

## 2015-12-15 LAB — HCG, QUANTITATIVE, PREGNANCY: HCG, BETA CHAIN, QUANT, S: 11196.4 m[IU]/mL — AB

## 2016-01-16 LAB — OB RESULTS CONSOLE GC/CHLAMYDIA
Chlamydia: NEGATIVE
GC PROBE AMP, GENITAL: NEGATIVE

## 2016-01-16 LAB — OB RESULTS CONSOLE HIV ANTIBODY (ROUTINE TESTING): HIV: NONREACTIVE

## 2016-01-16 LAB — OB RESULTS CONSOLE HEPATITIS B SURFACE ANTIGEN: HEP B S AG: NEGATIVE

## 2016-01-16 LAB — OB RESULTS CONSOLE RPR: RPR: NONREACTIVE

## 2016-01-16 LAB — OB RESULTS CONSOLE RUBELLA ANTIBODY, IGM: RUBELLA: IMMUNE

## 2016-04-08 ENCOUNTER — Telehealth: Payer: Self-pay | Admitting: Internal Medicine

## 2016-04-08 NOTE — Telephone Encounter (Signed)
On your schedule tomorrow for 930am

## 2016-04-08 NOTE — Telephone Encounter (Signed)
Patient Name: Kelsey Mcguire  DOB: 16-Jan-1981    Initial Comment Caller states she is [redacted] wks pregnant, having cold symptoms since Thursday, began to feel better over the weekend, but still has chest congestion and wheezing,    Nurse Assessment  Nurse: Leilani Merl, RN, Heather Date/Time (Eastern Time): 04/08/2016 11:52:51 AM  Confirm and document reason for call. If symptomatic, describe symptoms. You must click the next button to save text entered. ---Caller states she is [redacted] wks pregnant, having cold symptoms since Thursday, began to feel better over the weekend, but still has chest congestion and wheezing,  Has the patient traveled out of the country within the last 30 days? ---Not Applicable  Does the patient have any new or worsening symptoms? ---Yes  Will a triage be completed? ---Yes  Related visit to physician within the last 2 weeks? ---No  Does the PT have any chronic conditions? (i.e. diabetes, asthma, etc.) ---Yes  List chronic conditions. ---anemic  Is the patient pregnant or possibly pregnant? (Ask all females between the ages of 47-55) ---Yes  How many weeks gestation? ---17  What is the estimated delivery date? ---2016-08-14  Total number of pregnancies including current? ---1  Number of live births? ---0  Have you felt decreased fetal movement? ---No  Is this a behavioral health or substance abuse call? ---No     Guidelines    Guideline Title Affirmed Question Affirmed Notes  Cough - Acute Productive [1] Coughed up blood-tinged sputum AND [2] more than once    Final Disposition User   See PCP When Office is Open (within 3 days) Standifer, RN, Nira Conn    Comments  Appt with Lorane Gell tomorrow at 9:30 am.   Referrals  REFERRED TO PCP OFFICE   Disagree/Comply: Comply

## 2016-04-09 ENCOUNTER — Ambulatory Visit: Payer: Self-pay | Admitting: Nurse Practitioner

## 2016-04-09 ENCOUNTER — Ambulatory Visit (INDEPENDENT_AMBULATORY_CARE_PROVIDER_SITE_OTHER): Payer: Managed Care, Other (non HMO) | Admitting: Family

## 2016-04-09 VITALS — BP 120/80 | HR 90 | Temp 98.8°F | Ht 69.5 in | Wt 203.4 lb

## 2016-04-09 DIAGNOSIS — J209 Acute bronchitis, unspecified: Secondary | ICD-10-CM | POA: Diagnosis not present

## 2016-04-09 MED ORDER — ALBUTEROL SULFATE HFA 108 (90 BASE) MCG/ACT IN AERS: 2.0000 | INHALATION_SPRAY | Freq: Four times a day (QID) | RESPIRATORY_TRACT | Status: DC | PRN

## 2016-04-09 NOTE — Progress Notes (Signed)
Subjective:    Patient ID: Kelsey Mcguire, female    DOB: [birth year], 35 y.o.   MRN: [id number]   Kelsey Mcguire is a 35 y.o. female who presents today for an acute visit.    HPI Comments: Patient here for evaluation of cough and occasional wheezing for 7 days, improving.  5 days ago, she had myalgias, fever ( tmax 100F) and stayed in bed for a couple of days. During that time, she also had some nausea and vomiting which has resolved. She is able to eat and drink normally at this time. She is [redacted] weeks pregnant which has been a healthy pregnancy with her main c/o morning sickness. Tried mucinex, vicks vapor rub, tea, honey with some relief.   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  No h/o asthma. H/o allergies.   Past Medical History  Diagnosis Date  . Migraine   . Sinus infection   . Urinary tract infection   . Tourette syndrome   . IUD complication [facility])     spontaneously came out while on honeymoon   Augmentin Current Outpatient Prescriptions on File Prior to Visit  Medication Sig Dispense Refill  . Prenatal Vit-Fe Fumarate-FA (PRENATAL VITAMINS PLUS) 27-1 MG TABS Take 1 tablet by mouth daily. 90 tablet 3   No current facility-administered medications on file prior to visit.    Social History  Substance Use Topics  . Smoking status: Former Research scientist (life sciences)  . Smokeless tobacco: Current User     Comment: E-cigarette  . Alcohol Use: No    Review of Systems  Constitutional: Negative for fever, chills and unexpected weight change.  HENT: Positive for congestion. Negative for ear pain, sinus pressure and sore throat.   Eyes: Negative for visual disturbance.  Respiratory: Positive for cough and wheezing. Negative for shortness of breath.   Cardiovascular: Negative for chest pain, palpitations and leg swelling.  Gastrointestinal: Negative for nausea and vomiting.  Musculoskeletal: Negative for myalgias and arthralgias.  Skin: Negative for rash.    Neurological: Positive for headaches (mild, occasional, resolved).  Hematological: Negative for adenopathy.      Objective:    BP 120/80 mmHg  Pulse 90  Temp(Src) 98.8 F (37.1 C)  Ht 5' 9.5" (1.765 m)  Wt 203 lb 6.4 oz (92.262 kg)  BMI 29.62 kg/m2   Physical Exam  Constitutional: She appears well-developed and well-nourished.  HENT:  Head: Normocephalic and atraumatic.  Right Ear: Hearing, tympanic membrane, external ear and ear canal normal. No drainage, swelling or tenderness. No foreign bodies. Tympanic membrane is not erythematous and not bulging. No middle ear effusion. No decreased hearing is noted.  Left Ear: Hearing, tympanic membrane, external ear and ear canal normal. No drainage, swelling or tenderness. No foreign bodies. Tympanic membrane is not erythematous and not bulging.  No middle ear effusion. No decreased hearing is noted.  Nose: Rhinorrhea present. Right sinus exhibits no maxillary sinus tenderness and no frontal sinus tenderness. Left sinus exhibits no maxillary sinus tenderness and no frontal sinus tenderness.  Mouth/Throat: Uvula is midline, oropharynx is clear and moist and mucous membranes are normal. No oropharyngeal exudate, posterior oropharyngeal edema, posterior oropharyngeal erythema or tonsillar abscesses.  Eyes: Conjunctivae are normal.  Cardiovascular: Regular rhythm, normal heart sounds and normal pulses.   Pulmonary/Chest: Effort normal and breath sounds normal. She has no wheezes. She has no rhonchi. She has no rales.  Lymphadenopathy:       Head (right side): No submental,  no submandibular, no tonsillar, no preauricular, no posterior auricular and no occipital adenopathy present.       Head (left side): No submental, no submandibular, no tonsillar, no preauricular, no posterior auricular and no occipital adenopathy present.    She has no cervical adenopathy.  Neurological: She is alert.  Skin: Skin is warm and dry.  Psychiatric: She has a  normal mood and affect. Her speech is normal and behavior is normal. Thought content normal.  Vitals reviewed.      Assessment & Plan:   1. Acute bronchitis, unspecified organism Viral etiology, improving. Suspect patient may have had influenza last week however she is outside the window for treatment at this time. No acute distress. Patient afebrile.  We agreed upon conservative treatment as patient is feeling better and she is also pregnant.   - albuterol (PROVENTIL HFA) 108 (90 Base) MCG/ACT inhaler; Inhale 2 puffs into the lungs every 6 (six) hours as needed for wheezing or shortness of breath.  Dispense: 1 Inhaler; Refill: 1   I am having [name] maintain her PRENATAL VITAMINS PLUS, zolpidem, and FLUoxetine.   Meds ordered this encounter  Medications  . zolpidem (AMBIEN) 10 MG tablet    Sig: Take 10 mg by mouth daily.  Marland Kitchen FLUoxetine (PROZAC) 10 MG capsule    Sig: Take 10 mg by mouth daily.     Start medications as prescribed and explained to patient on After Visit Summary ( AVS). Risks, benefits, and alternatives of the medications and treatment plan prescribed today were discussed, and patient expressed understanding.   Education regarding symptom management and diagnosis given to patient.   Follow-up:Plan follow-up as discussed or as needed if any worsening symptoms or change in condition. No Follow-up on file.   Continue to follow with Rica Mast, MD for routine health maintenance.   Riki Rusk and I agreed with plan.   Mable Paris, FNP

## 2016-04-09 NOTE — Patient Instructions (Signed)
Use albuterol every 6 hours for first 24 hours to get good medication into the lungs and loosen congestion; after, you may use as needed and eventually stop all together when cough resolves.  Increase intake of clear fluids. Congestion is best treated by hydration, when mucus is wetter, it is thinner, less sticky, and easier to expel from the body, either through coughing up drainage, or by blowing your nose.   Get plenty of rest.   Use saline nasal drops and blow your nose frequently. Run a humidifier at night and elevate the head of the bed. Vicks Vapor rub will help with congestion and cough. Steam showers and sinus massage for congestion.   Use Acetaminophen as needed for fever or pain. Avoid second hand smoke. Even the smallest exposure will worsen symptoms.   Be sure to drink two glasses of water with each dose of Mucinex as the medication will not work well without adequate hydration.   You can also try a teaspoon of honey to see if this will help reduce cough. Throat lozenges can sometimes be beneficial as well.    This illness will typically last 7 - 10 days.   Please follow up with our clinic if you develop a fever greater than 101 F, symptoms worsen, or do not resolve in the next week.

## 2016-07-30 ENCOUNTER — Encounter (HOSPITAL_COMMUNITY): Payer: Self-pay | Admitting: *Deleted

## 2016-07-30 ENCOUNTER — Inpatient Hospital Stay (HOSPITAL_COMMUNITY)
Admission: AD | Admit: 2016-07-30 | Discharge: 2016-08-03 | DRG: 774 | Disposition: A | Discharge status: Home / Self Care | Payer: Managed Care, Other (non HMO) | Source: Ambulatory Visit | Attending: Obstetrics and Gynecology | Admitting: Obstetrics and Gynecology

## 2016-07-30 DIAGNOSIS — O99284 Endocrine, nutritional and metabolic diseases complicating childbirth: Secondary | ICD-10-CM | POA: Diagnosis present

## 2016-07-30 DIAGNOSIS — O1494 Unspecified pre-eclampsia, complicating childbirth: Secondary | ICD-10-CM | POA: Diagnosis present

## 2016-07-30 DIAGNOSIS — O99344 Other mental disorders complicating childbirth: Secondary | ICD-10-CM | POA: Diagnosis present

## 2016-07-30 DIAGNOSIS — D62 Acute posthemorrhagic anemia: Secondary | ICD-10-CM | POA: Diagnosis not present

## 2016-07-30 DIAGNOSIS — Z3A37 37 weeks gestation of pregnancy: Secondary | ICD-10-CM

## 2016-07-30 DIAGNOSIS — E876 Hypokalemia: Secondary | ICD-10-CM | POA: Diagnosis present

## 2016-07-30 DIAGNOSIS — O9081 Anemia of the puerperium: Secondary | ICD-10-CM | POA: Diagnosis not present

## 2016-07-30 DIAGNOSIS — Z87891 Personal history of nicotine dependence: Secondary | ICD-10-CM | POA: Diagnosis not present

## 2016-07-30 DIAGNOSIS — G43909 Migraine, unspecified, not intractable, without status migrainosus: Secondary | ICD-10-CM | POA: Diagnosis present

## 2016-07-30 DIAGNOSIS — F329 Major depressive disorder, single episode, unspecified: Secondary | ICD-10-CM | POA: Diagnosis present

## 2016-07-30 DIAGNOSIS — R030 Elevated blood-pressure reading, without diagnosis of hypertension: Secondary | ICD-10-CM | POA: Diagnosis present

## 2016-07-30 DIAGNOSIS — D509 Iron deficiency anemia, unspecified: Secondary | ICD-10-CM

## 2016-07-30 DIAGNOSIS — O1490 Unspecified pre-eclampsia, unspecified trimester: Secondary | ICD-10-CM

## 2016-07-30 HISTORY — DX: Anemia, unspecified: D64.9

## 2016-07-30 HISTORY — DX: Unspecified pre-eclampsia, unspecified trimester: O14.90

## 2016-07-30 LAB — COMPREHENSIVE METABOLIC PANEL
ALT: 13 U/L — ABNORMAL LOW (ref 14–54)
AST: 18 U/L (ref 15–41)
Albumin: 2.8 g/dL — ABNORMAL LOW (ref 3.5–5.0)
Alkaline Phosphatase: 137 U/L — ABNORMAL HIGH (ref 38–126)
Anion gap: 8 (ref 5–15)
BUN: 7 mg/dL (ref 6–20)
CHLORIDE: 108 mmol/L (ref 101–111)
CO2: 20 mmol/L — AB (ref 22–32)
CREATININE: 0.51 mg/dL (ref 0.44–1.00)
Calcium: 8.8 mg/dL — ABNORMAL LOW (ref 8.9–10.3)
GFR calc Af Amer: >60 mL/min (ref >60)
Glucose, Bld: 95 mg/dL (ref 65–99)
Potassium: 3 mmol/L — ABNORMAL LOW (ref 3.5–5.1)
SODIUM: 136 mmol/L (ref 135–145)
Total Bilirubin: 0.5 mg/dL (ref 0.3–1.2)
Total Protein: 6.6 g/dL (ref 6.5–8.1)

## 2016-07-30 LAB — CBC
HCT: 31.2 % — ABNORMAL LOW (ref 36.0–46.0)
HCT: 31.7 % — ABNORMAL LOW (ref 36.0–46.0)
HEMOGLOBIN: 10.9 g/dL — AB (ref 12.0–15.0)
Hemoglobin: 10.7 g/dL — ABNORMAL LOW (ref 12.0–15.0)
MCH: 28.9 pg (ref 26.0–34.0)
MCH: 29.1 pg (ref 26.0–34.0)
MCHC: 34.3 g/dL (ref 30.0–36.0)
MCHC: 34.4 g/dL (ref 30.0–36.0)
MCV: 84.3 fL (ref 78.0–100.0)
MCV: 84.8 fL (ref 78.0–100.0)
PLATELETS: 259 10*3/uL (ref 150–400)
PLATELETS: 269 10*3/uL (ref 150–400)
RBC: 3.7 MIL/uL — AB (ref 3.87–5.11)
RBC: 3.74 MIL/uL — ABNORMAL LOW (ref 3.87–5.11)
RDW: 13.7 % (ref 11.5–15.5)
RDW: 13.7 % (ref 11.5–15.5)
WBC: 10.1 10*3/uL (ref 4.0–10.5)
WBC: 10.4 10*3/uL (ref 4.0–10.5)

## 2016-07-30 LAB — PROTEIN / CREATININE RATIO, URINE
CREATININE, URINE: 169 mg/dL
PROTEIN CREATININE RATIO: 0.25 mg/mg{creat} — AB (ref 0.00–0.15)
TOTAL PROTEIN, URINE: 42 mg/dL

## 2016-07-30 LAB — URIC ACID: Uric Acid, Serum: 4.5 mg/dL (ref 2.3–6.6)

## 2016-07-30 LAB — OB RESULTS CONSOLE GBS: GBS: NEGATIVE

## 2016-07-30 MED ORDER — LACTATED RINGERS IV SOLN
500.0000 mL | INTRAVENOUS | Status: DC | PRN
  Administered 2016-08-01: 500 mL via INTRAVENOUS

## 2016-07-30 MED ORDER — OXYTOCIN 40 UNITS IN LACTATED RINGERS INFUSION - SIMPLE MED
1.0000 m[IU]/min | INTRAVENOUS | Status: DC
  Administered 2016-07-30: 2 m[IU]/min via INTRAVENOUS
  Filled 2016-07-30: qty 1000

## 2016-07-30 MED ORDER — LACTATED RINGERS IV SOLN
INTRAVENOUS | Status: DC
  Administered 2016-07-30 – 2016-08-02 (×3): via INTRAVENOUS

## 2016-07-30 MED ORDER — MAGNESIUM SULFATE 50 % IJ SOLN
2.0000 g/h | INTRAVENOUS | Status: DC
  Administered 2016-07-30 – 2016-08-02 (×5): 2 g/h via INTRAVENOUS
  Filled 2016-07-30 (×5): qty 80

## 2016-07-30 MED ORDER — TERBUTALINE SULFATE 1 MG/ML IJ SOLN: 0.2500 mg | Freq: Once | INTRAMUSCULAR | Status: DC | PRN

## 2016-07-30 MED ORDER — ONDANSETRON HCL 4 MG/2ML IJ SOLN
4.0000 mg | Freq: Four times a day (QID) | INTRAMUSCULAR | Status: DC | PRN
  Administered 2016-07-31: 4 mg via INTRAVENOUS
  Filled 2016-07-30: qty 2

## 2016-07-30 MED ORDER — HYDROCODONE-ACETAMINOPHEN 5-325 MG PO TABS
1.0000 | ORAL_TABLET | ORAL | Status: DC | PRN
  Administered 2016-07-30 – 2016-08-01 (×4): 2 via ORAL
  Filled 2016-07-30 (×4): qty 2

## 2016-07-30 MED ORDER — MAGNESIUM SULFATE BOLUS VIA INFUSION
6.0000 g | Freq: Once | INTRAVENOUS | Status: AC
  Administered 2016-07-30: 6 g via INTRAVENOUS
  Filled 2016-07-30: qty 500

## 2016-07-30 MED ORDER — OXYCODONE-ACETAMINOPHEN 5-325 MG PO TABS: 2.0000 | ORAL_TABLET | ORAL | Status: DC | PRN

## 2016-07-30 MED ORDER — OXYTOCIN BOLUS FROM INFUSION
500.0000 mL | Freq: Once | INTRAVENOUS | Status: AC
  Administered 2016-08-01: 500 mL via INTRAVENOUS

## 2016-07-30 MED ORDER — ACETAMINOPHEN 325 MG PO TABS
650.0000 mg | ORAL_TABLET | ORAL | Status: DC | PRN
  Administered 2016-07-30 – 2016-08-01 (×2): 650 mg via ORAL
  Filled 2016-07-30 (×2): qty 2

## 2016-07-30 MED ORDER — OXYTOCIN 40 UNITS IN LACTATED RINGERS INFUSION - SIMPLE MED: 2.5000 [IU]/h | INTRAVENOUS | Status: DC

## 2016-07-30 MED ORDER — OXYTOCIN 10 UNIT/ML IJ SOLN
10.0000 [IU] | Freq: Once | INTRAMUSCULAR | Status: DC
  Administered 2016-08-01: 10 [IU] via INTRAMUSCULAR

## 2016-07-30 MED ORDER — LIDOCAINE HCL (PF) 1 % IJ SOLN
30.0000 mL | INTRAMUSCULAR | Status: DC | PRN
  Filled 2016-07-30: qty 30

## 2016-07-30 MED ORDER — OXYCODONE-ACETAMINOPHEN 5-325 MG PO TABS: 1.0000 | ORAL_TABLET | ORAL | Status: DC | PRN

## 2016-07-30 MED ORDER — SOD CITRATE-CITRIC ACID 500-334 MG/5ML PO SOLN: 30.0000 mL | ORAL | Status: DC | PRN

## 2016-07-30 NOTE — MAU Note (Signed)
Dr Garwin Brothers sent her over, BP was up, increased swelling, 8# wk gain in 1 week. Has HA, no visual changes or epigastric pain.

## 2016-07-30 NOTE — H&P (Signed)
Kelsey Mcguire is a 35 y.o. female presenting@ 37 6/7 weeks  for admission due to elevated BP's, persistent h/a, leg swelling with associated 8lb weight gain in 6 days. (+) hx migraines but h/a which is on left side has not responded to h/a med. Denies blurry vision.   OB History    Gravida Para Term Preterm AB Living   1             SAB TAB Ectopic Multiple Live Births                 Past Medical History:  Diagnosis Date  . Anemia   . IUD complication ([place])    spontaneously came out while on honeymoon  . Migraine   . Sinus infection   . Tourette syndrome   . Urinary tract infection    Past Surgical History:  Procedure Laterality Date  . WISDOM TOOTH EXTRACTION     Family History: family history includes Anxiety disorder in her mother; Depression in her mother; Diabetes in her father; Heart disease in her father; Hypertension in her father; Tourette syndrome in her father. Social History:  reports that she has quit smoking. She uses smokeless tobacco. She reports that she does not drink alcohol. Her drug history is not on file.     Maternal Diabetes: No Genetic Screening: Declined Maternal Ultrasounds/Referrals: Normal Fetal Ultrasounds or other Referrals:  None Maternal Substance Abuse:  No Significant Maternal Medications:  Meds include: Other: prozac, ambien, klonopin Significant Maternal Lab Results:  Lab values include: Group B Strep negative Other Comments:  depression followed by psych( prozac, ambien) Restless leg syndrome, insomnia  Review of Systems  Eyes: Negative for blurred vision.  Respiratory: Negative for shortness of breath.   Musculoskeletal:       (+) leg swelling  Neurological: Positive for headaches.       H/a   Maternal Medical History:  Fetal activity: Perceived fetal activity is normal.    Prenatal complications: Pre-eclampsia.   Prenatal Complications - Diabetes: none.      Blood pressure 150/97, pulse 79, temperature 98.6 F (37 C),  temperature source Oral, resp. rate 18, last menstrual period 11/07/2015.  Patient Vitals for the past 24 hrs:  BP Temp Temp src Pulse Resp  07/30/16 1945 150/97 - - 79 -  07/30/16 1931 151/94 - - 77 -  07/30/16 1915 148/95 - - 79 -  07/30/16 1900 148/94 - - 77 -  07/30/16 1853 146/91 - - 79 -  07/30/16 1811 139/90 98.6 F (37 C) Oral 79 18   Maternal Exam:  Uterine Assessment: Contraction strength is mild.  Abdomen: Estimated fetal weight is 8lb.   Fetal presentation: vertex  Introitus: Normal vulva. Ferning test: not done.  Amniotic fluid character: not assessed.  Pelvis: adequate for delivery.   Cervix: Cervix evaluated by digital exam.     Physical Exam  Constitutional: She is oriented to person, place, and time. She appears well-developed and well-nourished.  Eyes: EOM are normal.  Neck: Neck supple.  Cardiovascular: Regular rhythm.   Respiratory: Breath sounds normal.  Musculoskeletal: She exhibits edema.  Neurological: She is alert and oriented to person, place, and time.  Skin: Skin is warm and dry.  Psychiatric: She has a normal mood and affect.    Prenatal labs: ABO, Rh:  A positive Antibody:  neg Rubella:  Immune RPR:   NR HBsAg:   neg HIV:   Neg GBS:   neg CBC  Component Value Date/Time   WBC 10.1 07/30/2016 1841   RBC 3.70 (L) 07/30/2016 1841   HGB 10.7 (L) 07/30/2016 1841   HCT 31.2 (L) 07/30/2016 1841   PLT 259 07/30/2016 1841   MCV 84.3 07/30/2016 1841   MCH 28.9 07/30/2016 1841   MCHC 34.3 07/30/2016 1841   RDW 13.7 07/30/2016 1841   LYMPHSABS 1.8 06/02/2015 1324   MONOABS 0.3 06/02/2015 1324   EOSABS 0.2 06/02/2015 1324   BASOSABS 0.0 06/02/2015 1324   CMP Latest Ref Rng & Units 07/30/2016 06/02/2015 01/27/2015  Glucose 65 - 99 mg/dL 95 100(H) 97  BUN 6 - 20 mg/dL 7 9 15   Creatinine 0.44 - 1.00 mg/dL 0.51 0.63 0.60  Sodium 135 - 145 mmol/L 136 137 137  Potassium 3.5 - 5.1 mmol/L 3.0(L) 4.3 4.7  Chloride 101 - 111 mmol/L 108 103 105   CO2 22 - 32 mmol/L 20(L) 28 27  Calcium 8.9 - 10.3 mg/dL 8.8(L) 9.2 9.3  Total Protein 6.5 - 8.1 g/dL 6.6 7.2 6.7  Total Bilirubin 0.3 - 1.2 mg/dL 0.5 0.5 0.6  Alkaline Phos 38 - 126 U/L 137(H) 57 44  AST 15 - 41 U/L 18 17 15   ALT 14 - 54 U/L 13(L) 17 13  protein/creatinine ratio 0.25  tracing:  Baseline 130 (+) accel to 180 Ctx q 4-5 mins  Assessment/Plan: Preeclampsia IUP @ 37 6/7 weeks Depression hypokalemia P) admit. Routine admit labs. Magnesium sulfate. Pitocin induction. Analgesic prn. Cont prozac. Replete K. Nitrous oxide  Jarmon Javid A 07/30/2016, 8:07 PM

## 2016-07-30 NOTE — MAU Provider Note (Signed)
History     Chief Complaint  Patient presents with  . Leg Swelling  . Hypertension  35 yo G1P0 MWF @ 7 6/[redacted] weeks gestation sent from the office for evaluation for preeclampsia( labs, serial BP, NST). Pt presented for routine OB care with c/o headache since yesterday not responsive to usual med. (+) leg swelling( increased). No blurry vision. 8lb weight gain in 6 days. BP 140/90   OB History    Gravida Para Term Preterm AB Living   1             SAB TAB Ectopic Multiple Live Births                  Past Medical History:  Diagnosis Date  . Anemia   . IUD complication ([place])    spontaneously came out while on honeymoon  . Migraine   . Sinus infection   . Tourette syndrome   . Urinary tract infection     Past Surgical History:  Procedure Laterality Date  . WISDOM TOOTH EXTRACTION      Family History  Problem Relation Age of Onset  . Depression Mother   . Anxiety disorder Mother   . Heart disease Father   . Hypertension Father   . Diabetes Father   . Tourette syndrome Father     Social History  Substance Use Topics  . Smoking status: Former Research scientist (life sciences)  . Smokeless tobacco: Current User     Comment: E-cigarette  . Alcohol use No    Allergies:  Allergies  Allergen Reactions  . Augmentin [Amoxicillin-Pot Clavulanate] Nausea Only    Prescriptions Prior to Admission  Medication Sig Dispense Refill Last Dose  . acetaminophen (TYLENOL) 325 MG tablet Take 650 mg by mouth every 6 (six) hours as needed for mild pain.   Past Week at Unknown time  . acetaminophen-codeine (TYLENOL #3) 300-30 MG tablet Take 1 tablet by mouth every 6 (six) hours as needed for moderate pain.   07/29/2016 at Unknown time  . clonazePAM (KLONOPIN) 1 MG tablet Take 1 tablet by mouth at bedtime.   07/29/2016 at Unknown time  . ferrous sulfate 325 (65 FE) MG tablet Take by mouth daily.   07/30/2016 at Unknown time  . FLUoxetine (PROZAC) 10 MG capsule Take 10 mg by mouth daily.   07/30/2016 at Unknown  time  . pantoprazole (PROTONIX) 40 MG tablet Take 40 mg by mouth daily.   07/30/2016 at Unknown time  . Prenatal Vit-Fe Fumarate-FA (PRENATAL VITAMINS PLUS) 27-1 MG TABS Take 1 tablet by mouth daily. 90 tablet 3 07/30/2016 at Unknown time  . zolpidem (AMBIEN) 10 MG tablet Take 10 mg by mouth at bedtime.    07/29/2016 at Unknown time  . albuterol (PROVENTIL HFA) 108 (90 Base) MCG/ACT inhaler Inhale 2 puffs into the lungs every 6 (six) hours as needed for wheezing or shortness of breath. (Patient not taking: Reported on 07/30/2016) 1 Inhaler 1 Not Taking at Unknown time     Physical Exam   VS; BP 146/91, 148/94 p79 98.6  No exam performed today, done in office. Tracing: reactive baseline 130 IMP: Elevated BP with assoc h/a, leg swelling and abnl weight gain IUP @ 37 6/7 weeks P) cbc, cmp, protein/creatinine ratio ED Course   MDM  CBC    Component Value Date/Time   WBC 10.1 07/30/2016 1841   RBC 3.70 (L) 07/30/2016 1841   HGB 10.7 (L) 07/30/2016 1841   HCT 31.2 (L) 07/30/2016 1841  PLT 259 07/30/2016 1841   MCV 84.3 07/30/2016 1841   MCH 28.9 07/30/2016 1841   MCHC 34.3 07/30/2016 1841   RDW 13.7 07/30/2016 1841   LYMPHSABS 1.8 06/02/2015 1324   MONOABS 0.3 06/02/2015 1324   EOSABS 0.2 06/02/2015 1324   BASOSABS 0.0 06/02/2015 1324   CMP Latest Ref Rng & Units 07/30/2016 06/02/2015 01/27/2015  Glucose 65 - 99 mg/dL 95 100(H) 97  BUN 6 - 20 mg/dL 7 9 15   Creatinine 0.44 - 1.00 mg/dL 0.51 0.63 0.60  Sodium 135 - 145 mmol/L 136 137 137  Potassium 3.5 - 5.1 mmol/L 3.0(L) 4.3 4.7  Chloride 101 - 111 mmol/L 108 103 105  CO2 22 - 32 mmol/L 20(L) 28 27  Calcium 8.9 - 10.3 mg/dL 8.8(L) 9.2 9.3  Total Protein 6.5 - 8.1 g/dL 6.6 7.2 6.7  Total Bilirubin 0.3 - 1.2 mg/dL 0.5 0.5 0.6  Alkaline Phos 38 - 126 U/L 137(H) 57 44  AST 15 - 41 U/L 18 17 15   ALT 14 - 54 U/L 13(L) 17 13  protein/creatinine ratio 0.25  Will admit given persistence of h/a and elevated BP for  IOL  Trase Bunda A, MD 8:00 PM 07/30/2016

## 2016-07-30 NOTE — Anesthesia Pain Management Evaluation Note (Signed)
  CRNA Pain Management Visit Note  Patient: Kelsey Mcguire, 35 y.o., female  "Hello I am a member of the anesthesia team at [facility]. We have an anesthesia team available at all times to provide care throughout the hospital, including epidural management and anesthesia for C-section. I don't know your plan for the delivery whether it a natural birth, water birth, IV sedation, nitrous supplementation, doula or epidural, but we want to meet your pain goals."   1.Was your pain managed to your expectations on prior hospitalizations?   Yes   2.What is your expectation for pain management during this hospitalization?     Epidural  3.How can we help you reach that goal nursing intervention  Record the patient's initial score and the patient's pain goal.   Pain: 4/10   Pain Goal: 4/10 [facility] wants you to be able to say your pain was always managed very well.  Ailene Ards 07/30/2016

## 2016-07-30 NOTE — MAU Note (Signed)
Patient was sent from office for PI[facility] eval.

## 2016-07-31 LAB — COMPREHENSIVE METABOLIC PANEL
ALBUMIN: 2.6 g/dL — AB (ref 3.5–5.0)
ALT: 14 U/L (ref 14–54)
AST: 19 U/L (ref 15–41)
Alkaline Phosphatase: 125 U/L (ref 38–126)
Anion gap: 6 (ref 5–15)
BILIRUBIN TOTAL: 0.3 mg/dL (ref 0.3–1.2)
CHLORIDE: 107 mmol/L (ref 101–111)
CO2: 23 mmol/L (ref 22–32)
CREATININE: 0.53 mg/dL (ref 0.44–1.00)
Calcium: 7.1 mg/dL — ABNORMAL LOW (ref 8.9–10.3)
GFR calc Af Amer: >60 mL/min (ref >60)
GLUCOSE: 95 mg/dL (ref 65–99)
POTASSIUM: 2.9 mmol/L — AB (ref 3.5–5.1)
Sodium: 136 mmol/L (ref 135–145)
TOTAL PROTEIN: 6.4 g/dL — AB (ref 6.5–8.1)

## 2016-07-31 LAB — CBC
HCT: 32.2 % — ABNORMAL LOW (ref 36.0–46.0)
HEMATOCRIT: 31.8 % — AB (ref 36.0–46.0)
HEMOGLOBIN: 10.5 g/dL — AB (ref 12.0–15.0)
HEMOGLOBIN: 10.7 g/dL — AB (ref 12.0–15.0)
MCH: 28.2 pg (ref 26.0–34.0)
MCH: 28.5 pg (ref 26.0–34.0)
MCHC: 33 g/dL (ref 30.0–36.0)
MCHC: 33.2 g/dL (ref 30.0–36.0)
MCV: 85.5 fL (ref 78.0–100.0)
MCV: 85.9 fL (ref 78.0–100.0)
PLATELETS: 239 10*3/uL (ref 150–400)
Platelets: 251 10*3/uL (ref 150–400)
RBC: 3.72 MIL/uL — AB (ref 3.87–5.11)
RBC: 3.75 MIL/uL — AB (ref 3.87–5.11)
RDW: 13.8 % (ref 11.5–15.5)
RDW: 13.8 % (ref 11.5–15.5)
WBC: 10.1 10*3/uL (ref 4.0–10.5)
WBC: 8.4 10*3/uL (ref 4.0–10.5)

## 2016-07-31 LAB — MAGNESIUM
MAGNESIUM: 4.3 mg/dL — AB (ref 1.7–2.4)
Magnesium: 5.2 mg/dL — ABNORMAL HIGH (ref 1.7–2.4)

## 2016-07-31 LAB — ABO/RH: ABO/RH(D): A POS

## 2016-07-31 LAB — RPR: RPR Ser Ql: NONREACTIVE

## 2016-07-31 MED ORDER — MISOPROSTOL 200 MCG PO TABS
50.0000 ug | ORAL_TABLET | ORAL | Status: DC
  Administered 2016-07-31 – 2016-08-01 (×6): 50 ug via ORAL
  Filled 2016-07-31 (×6): qty 0.5

## 2016-07-31 MED ORDER — FLUOXETINE HCL 10 MG PO CAPS
10.0000 mg | ORAL_CAPSULE | Freq: Every day | ORAL | Status: DC
  Administered 2016-07-31 – 2016-08-03 (×4): 10 mg via ORAL
  Filled 2016-07-31 (×5): qty 1

## 2016-07-31 MED ORDER — DIPHENHYDRAMINE HCL 50 MG/ML IJ SOLN
25.0000 mg | Freq: Once | INTRAMUSCULAR | Status: AC
  Administered 2016-07-31: 25 mg via INTRAVENOUS
  Filled 2016-07-31: qty 1

## 2016-07-31 MED ORDER — METOCLOPRAMIDE HCL 5 MG/5ML PO SOLN: 10.0000 mg | Freq: Once | ORAL | Status: DC

## 2016-07-31 MED ORDER — METOCLOPRAMIDE HCL 5 MG/ML IJ SOLN
10.0000 mg | Freq: Once | INTRAMUSCULAR | Status: AC
  Administered 2016-07-31: 10 mg via INTRAVENOUS
  Filled 2016-07-31: qty 2

## 2016-07-31 MED ORDER — BUTALBITAL-APAP-CAFFEINE 50-325-40 MG PO TABS
2.0000 | ORAL_TABLET | Freq: Once | ORAL | Status: AC
  Administered 2016-07-31: 2 via ORAL
  Filled 2016-07-31: qty 2

## 2016-07-31 MED ORDER — DEXAMETHASONE SODIUM PHOSPHATE 10 MG/ML IJ SOLN
10.0000 mg | Freq: Once | INTRAMUSCULAR | Status: AC
  Administered 2016-07-31: 10 mg via INTRAVENOUS
  Filled 2016-07-31: qty 1

## 2016-07-31 MED ORDER — POTASSIUM CHLORIDE CRYS ER 20 MEQ PO TBCR
40.0000 meq | EXTENDED_RELEASE_TABLET | Freq: Two times a day (BID) | ORAL | Status: DC
  Administered 2016-07-31 – 2016-08-01 (×3): 40 meq via ORAL
  Filled 2016-07-31 (×5): qty 2

## 2016-07-31 MED ORDER — ZOLPIDEM TARTRATE 5 MG PO TABS
5.0000 mg | ORAL_TABLET | Freq: Every evening | ORAL | Status: DC | PRN
Start: 2016-07-31 — End: 2016-08-02
  Administered 2016-07-31 (×2): 5 mg via ORAL
  Filled 2016-07-31 (×2): qty 1

## 2016-07-31 NOTE — Progress Notes (Signed)
Kelsey Mcguire is a 35 y.o. G1P0 at [redacted]w[redacted]d by ultrasound admitted for induction of labor due to Pre-eclamptic toxemia of pregnancy..  Subjective: Chief Complaint  Patient presents with  . Leg Swelling  . Hypertension   S; h/a improved with vicodin  Unable to sleep w/o ambien  Objective: pitocin 12 miu  BP (!) 143/91   Pulse 67   Temp 98 F (36.7 C) (Oral)   Resp 18   Ht 5\' 10"  (1.778 m)   Wt 99.8 kg (220 lb)   LMP 11/07/2015 (Exact Date)   SpO2 98%   BMI 31.57 kg/m  No intake/output data recorded. No intake/output data recorded.  FHT:  FHR: 130 bpm, variability: marked,  accelerations:  Present,  decelerations:  Absent UC:   irregular, every 4-5 minutes SVE:   2 cm dilated, 60% effaced, -2 station Tracing: cat 1  Labs: Lab Results  Component Value Date   WBC 10.4 07/30/2016   HGB 10.9 (L) 07/30/2016   HCT 31.7 (L) 07/30/2016   MCV 84.8 07/30/2016   PLT 269 07/30/2016    Assessment / Plan: Preeclampsia  on magnesium sulfate Term P) cont pitocin. Magnesium level. Foley.ambien. Strict I &O   Anticipated MOD:  NSVD  [name] 07/31/2016, 2:51 AM

## 2016-07-31 NOTE — Progress Notes (Signed)
S/p cytotec #2  Notes h/a back at a 6/10  O: BP (!) 136/92   Pulse 73   Temp 98.3 F (36.8 C) (Oral)   Resp 18   Ht 5\' 10"  (1.778 m)   Wt 99.8 kg (220 lb)   LMP 11/07/2015 (Exact Date)   SpO2 100%   BMI 31.57 kg/m   VE  Deferred  Tracing: cat 1 occ ctx   IMP: persistent/recurrent h/a  Despite  vicodin & fioricet Preeclampsia on Magnesium sulfate Hypokalemia on K+ repletion med . P) reglan/decadronbenadryl IV protocol for migraine. Recheck PIH labs, magnesium. Restart prozac. Cont cervical ripening with oral cytotec

## 2016-07-31 NOTE — Progress Notes (Signed)
Kelsey Mcguire is a 35 y.o. G1P0 at [redacted]w[redacted]d by ultrasound admitted for induction of labor due to Pre-eclamptic toxemia of pregnancy..  Subjective: Chief Complaint  Patient presents with  . Leg Swelling  . Hypertension    Objective: BP 138/84   Pulse 75   Temp 97.9 F (36.6 C) (Oral)   Resp 18   Ht 5\' 10"  (1.778 m)   Wt 99.8 kg (220 lb)   LMP 11/07/2015 (Exact Date)   SpO2 98%   BMI 31.57 kg/m  I/O last 3 completed shifts: In: 1428.6 [I.V.:1428.6] Out: 1700 [Urine:1700] Total I/O In: 122.8 [I.V.:122.8] Out: 350 [Urine:350]  FHT:  FHR: 125 bpm, variability: moderate,  accelerations:  Present,  decelerations:  Absent UC:   none SVE:  deferred Tracing: cat 1  Labs: Lab Results  Component Value Date   WBC 10.4 07/30/2016   HGB 10.9 (L) 07/30/2016   HCT 31.7 (L) 07/30/2016   MCV 84.8 07/30/2016   PLT 269 07/30/2016  mag 4.3  Assessment / Plan: Preclampsia  Depression hypokalemia P)  IV magnesium. Pitocin periodic mag level. Follow I & O  Anticipated MOD:  NSVD  Kelsey Mcguire A 07/31/2016, 7:32 AM

## 2016-07-31 NOTE — Progress Notes (Signed)
S: c/o headache. Thinks migraine Just had another vicodin Notes cramps only  O; Pitocin 12 miu ( down from 20 miu max) BP (!) 142/90   Pulse 72   Temp 97.9 F (36.6 C) (Oral)   Resp 14   Ht 5\' 10"  (1.778 m)   Wt 99.8 kg (220 lb)   LMP 11/07/2015 (Exact Date)   SpO2 100%   BMI 31.57 kg/m   VE: 1/swollen/thick/-2   Tracing; cat 1 Ctx q 2-3 mins  IMP: H/a not responsive well to vicodin Still elevated BP on magnesium P) stop pitocin. Use oral  cytotec for ripening. Recheck labs( mag, CMP, cbc) Fioricet x2 for h/a.

## 2016-07-31 NOTE — Progress Notes (Signed)
Pt alert, sitting upright. Pt appears chipper and accepting. Pt states "feeling the best she has felt in past 3 days."

## 2016-08-01 ENCOUNTER — Inpatient Hospital Stay (HOSPITAL_COMMUNITY): Payer: Managed Care, Other (non HMO) | Admitting: Anesthesiology

## 2016-08-01 LAB — CBC
HCT: 32.5 % — ABNORMAL LOW (ref 36.0–46.0)
HCT: 30.3 % — ABNORMAL LOW (ref 36.0–46.0)
HCT: 25.1 % — ABNORMAL LOW (ref 36.0–46.0)
HEMOGLOBIN: 9.9 g/dL — AB (ref 12.0–15.0)
Hemoglobin: 11 g/dL — ABNORMAL LOW (ref 12.0–15.0)
Hemoglobin: 8.3 g/dL — ABNORMAL LOW (ref 12.0–15.0)
MCH: 29.3 pg (ref 26.0–34.0)
MCH: 28.5 pg (ref 26.0–34.0)
MCH: 28.7 pg (ref 26.0–34.0)
MCHC: 33.8 g/dL (ref 30.0–36.0)
MCHC: 32.7 g/dL (ref 30.0–36.0)
MCHC: 33.1 g/dL (ref 30.0–36.0)
MCV: 86.4 fL (ref 78.0–100.0)
MCV: 87.3 fL (ref 78.0–100.0)
MCV: 86.9 fL (ref 78.0–100.0)
PLATELETS: 257 10*3/uL (ref 150–400)
PLATELETS: 254 10*3/uL (ref 150–400)
PLATELETS: 247 10*3/uL (ref 150–400)
RBC: 3.76 MIL/uL — ABNORMAL LOW (ref 3.87–5.11)
RBC: 3.47 MIL/uL — AB (ref 3.87–5.11)
RBC: 2.89 MIL/uL — ABNORMAL LOW (ref 3.87–5.11)
RDW: 14 % (ref 11.5–15.5)
RDW: 14.1 % (ref 11.5–15.5)
RDW: 14 % (ref 11.5–15.5)
WBC: 10.2 10*3/uL (ref 4.0–10.5)
WBC: 14.1 10*3/uL — ABNORMAL HIGH (ref 4.0–10.5)
WBC: 22.4 10*3/uL — ABNORMAL HIGH (ref 4.0–10.5)

## 2016-08-01 LAB — DIC (DISSEMINATED INTRAVASCULAR COAGULATION)PANEL
INR: 1.08
Platelets: 258 10*3/uL (ref 150–400)
Prothrombin Time: 14 seconds (ref 11.4–15.2)
aPTT: 25 seconds (ref 24–36)

## 2016-08-01 LAB — POSTPARTUM HEMORRHAGE PROTOCOL (BB NOTIFICATION)

## 2016-08-01 LAB — DIC (DISSEMINATED INTRAVASCULAR COAGULATION) PANEL
D DIMER QUANT: 3.75 ug{FEU}/mL — AB (ref 0.00–0.50)
FIBRINOGEN: 536 mg/dL — AB (ref 210–475)
SMEAR REVIEW: NONE SEEN

## 2016-08-01 LAB — MAGNESIUM
MAGNESIUM: 5.2 mg/dL — AB (ref 1.7–2.4)
MAGNESIUM: 5.3 mg/dL — AB (ref 1.7–2.4)

## 2016-08-01 LAB — PREPARE RBC (CROSSMATCH)

## 2016-08-01 MED ORDER — LIDOCAINE HCL (PF) 1 % IJ SOLN
INTRAMUSCULAR | Status: DC | PRN
  Administered 2016-08-01 (×2): 4 mL via EPIDURAL

## 2016-08-01 MED ORDER — PHENYLEPHRINE 40 MCG/ML (10ML) SYRINGE FOR IV PUSH (FOR BLOOD PRESSURE SUPPORT)
80.0000 ug | PREFILLED_SYRINGE | INTRAVENOUS | Status: DC | PRN
  Filled 2016-08-01: qty 5

## 2016-08-01 MED ORDER — LACTATED RINGERS IV SOLN: 500.0000 mL | Freq: Once | INTRAVENOUS | Status: DC

## 2016-08-01 MED ORDER — DIPHENHYDRAMINE HCL 50 MG/ML IJ SOLN: 12.5000 mg | INTRAMUSCULAR | Status: DC | PRN

## 2016-08-01 MED ORDER — OXYTOCIN 10 UNIT/ML IJ SOLN
INTRAMUSCULAR | Status: AC
  Filled 2016-08-01: qty 1

## 2016-08-01 MED ORDER — DIPHENHYDRAMINE HCL 50 MG/ML IJ SOLN
12.5000 mg | INTRAMUSCULAR | Status: DC | PRN
  Administered 2016-08-01: 25 mg via INTRAVENOUS
  Filled 2016-08-01: qty 1

## 2016-08-01 MED ORDER — OXYTOCIN 40 UNITS IN LACTATED RINGERS INFUSION - SIMPLE MED
1.0000 m[IU]/min | INTRAVENOUS | Status: DC
  Administered 2016-08-01: 2 m[IU]/min via INTRAVENOUS
  Filled 2016-08-01: qty 1000

## 2016-08-01 MED ORDER — PHENYLEPHRINE 40 MCG/ML (10ML) SYRINGE FOR IV PUSH (FOR BLOOD PRESSURE SUPPORT)
80.0000 ug | PREFILLED_SYRINGE | INTRAVENOUS | Status: DC | PRN
  Filled 2016-08-01: qty 5
  Filled 2016-08-01: qty 10

## 2016-08-01 MED ORDER — TERBUTALINE SULFATE 1 MG/ML IJ SOLN: 0.2500 mg | Freq: Once | INTRAMUSCULAR | Status: DC | PRN

## 2016-08-01 MED ORDER — EPHEDRINE 5 MG/ML INJ
10.0000 mg | INTRAVENOUS | Status: DC | PRN
  Filled 2016-08-01: qty 4

## 2016-08-01 MED ORDER — FENTANYL 2.5 MCG/ML BUPIVACAINE 1/10 % EPIDURAL INFUSION (WH - ANES)
14.0000 mL/h | INTRAMUSCULAR | Status: DC | PRN
Start: 2016-08-01 — End: 2016-08-02
  Administered 2016-08-01 (×2): 14 mL/h via EPIDURAL
  Filled 2016-08-01: qty 125

## 2016-08-01 MED ORDER — MISOPROSTOL 200 MCG PO TABS
800.0000 ug | ORAL_TABLET | Freq: Once | ORAL | Status: AC
  Administered 2016-08-01: 800 ug via RECTAL

## 2016-08-01 MED ORDER — PHENYLEPHRINE 40 MCG/ML (10ML) SYRINGE FOR IV PUSH (FOR BLOOD PRESSURE SUPPORT): 80.0000 ug | PREFILLED_SYRINGE | INTRAVENOUS | Status: DC | PRN

## 2016-08-01 MED ORDER — EPHEDRINE 5 MG/ML INJ: 10.0000 mg | INTRAVENOUS | Status: DC | PRN

## 2016-08-01 MED ORDER — MISOPROSTOL 200 MCG PO TABS
ORAL_TABLET | ORAL | Status: AC
  Filled 2016-08-01: qty 4

## 2016-08-01 MED ORDER — SODIUM CHLORIDE 0.9 % IV SOLN: Freq: Once | INTRAVENOUS | Status: DC

## 2016-08-01 NOTE — Progress Notes (Signed)
Patient with panic attack at 1012. Patient feels "panicky, moody", with headache rated at 4 and blurry vision. BP checked on left arm 184/121. RN changed cuff to right arm, reading 159/88.   Dr Garwin Brothers notified by RN of patient status as well as Vital Signs. Dr stated she was not concerned with the Vital Signs and would do nothing at this time.   RN will continue to monitor patient mental status, physical status and closely monitor vital signs. RN will notify Dr Garwin Brothers of any further abnormal readings.   [place], [place] 08/01/2016 1030

## 2016-08-01 NOTE — Progress Notes (Signed)
Per RN  cytotec given @ 2:30 am Pt sleeping H/a improved ( down to 1) VE 2-3/thick  Tracing reviewed; cat 1 Mag 5.2  CBC    Component Value Date/Time   WBC 8.4 07/31/2016 1450   RBC 3.75 (L) 07/31/2016 1450   HGB 10.7 (L) 07/31/2016 1450   HCT 32.2 (L) 07/31/2016 1450   PLT 239 07/31/2016 1450   MCV 85.9 07/31/2016 1450   MCH 28.5 07/31/2016 1450   MCHC 33.2 07/31/2016 1450   RDW 13.8 07/31/2016 1450   LYMPHSABS 1.8 06/02/2015 1324   MONOABS 0.3 06/02/2015 1324   EOSABS 0.2 06/02/2015 1324   BASOSABS 0.0 06/02/2015 1324  u.o  250-400cc/hr  IMP: H/a responded to combination medication Preeclampsia Term gestation P) cont with cervical ripening. Will recheck magnesium. If higher, will need to adjust magnesium rate

## 2016-08-01 NOTE — Progress Notes (Signed)
S: c/o having not slept well last night Despite the fact that nursing and husband heard pt snoring  O: BP 134/83 Lungs clear to A Cor RRR Abd gravid nontender Pelvic 2/50/-2 Extr: (+) 1-2 edema  Tracing: baseline 130 (+) accel Some ctx   mag 5.2 plt 239K  IMP: latent phase Preeclampsia on Magnesium sulfate Depression on prozac P) cont with ripening with cytotec. Cont with magnesium. Defer amniotomy Cont words of encouragement

## 2016-08-01 NOTE — Anesthesia Procedure Notes (Signed)
Epidural Patient location during procedure: OB Start time: 08/01/2016 6:02 PM End time: 08/01/2016 6:09 PM  Staffing Anesthesiologist: Suella Broad D Performed: anesthesiologist   Preanesthetic Checklist Completed: patient identified, site marked, surgical consent, pre-op evaluation, timeout performed, IV checked, risks and benefits discussed and monitors and equipment checked  Epidural Patient position: sitting Prep: ChloraPrep Patient monitoring: heart rate, continuous pulse ox and blood pressure Approach: midline Location: L3-L4 Injection technique: LOR saline  Needle:  Needle type: Tuohy  Needle gauge: 17 G Needle length: 9 cm Catheter type: closed end flexible Catheter size: 20 Guage Test dose: negative and 1.5% lidocaine  Assessment Events: blood not aspirated, injection not painful, no injection resistance and no paresthesia  Additional Notes LOR @ 5.5  Patient identified. Risks/Benefits/Options discussed with patient including but not limited to bleeding, infection, nerve damage, paralysis, failed block, incomplete pain control, headache, blood pressure changes, nausea, vomiting, reactions to medications, itching and postpartum back pain. Confirmed with bedside nurse the patient's most recent platelet count. Confirmed with patient that they are not currently taking any anticoagulation, have any bleeding history or any family history of bleeding disorders. Patient expressed understanding and wished to proceed. All questions were answered. Sterile technique was used throughout the entire procedure. Please see nursing notes for vital signs. Test dose was given through epidural catheter and negative prior to continuing to dose epidural or start infusion. Warning signs of high block given to the patient including shortness of breath, tingling/numbness in hands, complete motor block, or any concerning symptoms with instructions to call for help. Patient was given instructions on  fall risk and not to get out of bed. All questions and concerns addressed with instructions to call with any issues or inadequate analgesia.    Reason for block:procedure for pain

## 2016-08-01 NOTE — Progress Notes (Signed)
S:  Had a panic attack earlier Due for next cytotec @ 4;15 pm,  O: VS BP 146/88, 148/87 VE 4/60/-2 AROm clear with some clots. IUPC placed   Tracing:  Baseline 125  (+) accels  Ctx q 2-3 mins  U.o 200cc/hr  IMP: preeclampsia on Magnesium  Term gestation Depression on prozac Hypokalemia P) start pitocin in an hour. Recheck magnesium, CBC, CMET. Epidural for pain mgmt

## 2016-08-01 NOTE — Progress Notes (Signed)
Patient vocalized feeling exhausted, frustrated, tired of "this", and feeling like there isn't a plan. Also, "cloudy-headed" and unable to process new information. Husband at bedside and supportive.   RN educated patient about Magnesium and the side effects related to being on the medication. Patient frustrated with being confined to the bed/room, and with having a catheter. RN educated patient regarding the need for a Foley cath regarding strict I&O, since being on mag.   Also spoke with patient about the plan, and how being a primipara at 32 weeks typically takes much longer to go into active labor.   RN suggested patient change to wireless monitors for in the room ambulation, but patient did not want to do this at this time.   Encouraged patient and told her to continue to ask questions regarding care.  Liz Claiborne RN 08/01/2016 0900

## 2016-08-01 NOTE — Progress Notes (Signed)
Kelsey Mcguire is a 35 y.o. G1P0 at [redacted]w[redacted]d by ultrasound admitted for induction of labor due to Pre-eclamptic toxemia of pregnancy..  Subjective: Chief Complaint  Patient presents with  . Leg Swelling  . Hypertension  In good spirits Epidural Pitocin  Objective: BP 137/80   Pulse 75   Temp 98.2 F (36.8 C) (Oral)   Resp 18   Ht 5\' 10"  (1.778 m)   Wt 99.8 kg (220 lb)   LMP 11/07/2015 (Exact Date)   SpO2 100%   BMI 31.57 kg/m  I/O last 3 completed shifts: In: 7411.4 [P.O.:3192; I.V.:4219.4] Out: 8530 [Urine:8530] Total I/O In: 45 [P.O.:60] Out: 175 [Urine:175]  FHT:  FHR: 130 bpm, variability: moderate,  accelerations:  Present,  decelerations:  Absent UC:   regular, every 2 1/2- 3 minutes SVE:   4-5/100/-1. IUPC Tracing: cat 1   mag 5.3 Labs: Lab Results  Component Value Date   WBC 10.2 08/01/2016   HGB 11.0 (L) 08/01/2016   HCT 32.5 (L) 08/01/2016   MCV 86.4 08/01/2016   PLT 257 08/01/2016    Assessment / Plan: Induction of labor due to preeclampsia,  progressing well on pitocin now Depression on prozac Hypokalemia P0 right exaggerated sims with peanut. Cont magnesium  Anticipated MOD:  NSVD  Kelsey Mcguire A 08/01/2016, 8:26 PM

## 2016-08-01 NOTE — Progress Notes (Signed)
Patient continues to c/o of feeling cold. Bear hugger given to patient.

## 2016-08-01 NOTE — Anesthesia Preprocedure Evaluation (Addendum)
Anesthesia Evaluation  Patient identified by MRN, date of birth, ID band Patient awake    Reviewed: Allergy & Precautions, NPO status , Patient's Chart, lab work & pertinent test results  History of Anesthesia Complications Negative for: history of anesthetic complications  Airway Mallampati: II       Dental  (+) Teeth Intact   Pulmonary neg shortness of breath, neg sleep apnea, neg COPD, neg recent URI, former smoker,    breath sounds clear to auscultation       Cardiovascular hypertension,  Rhythm:Regular Rate:Normal     Neuro/Psych  Headaches, neg Seizures Tourette syndrome    GI/Hepatic   Endo/Other    Renal/GU      Musculoskeletal   Abdominal   Peds  Hematology  (+) Blood dyscrasia, anemia ,   Anesthesia Other Findings Pre-eclampsia, Raynaud's  Reproductive/Obstetrics                           Lab Results  Component Value Date   WBC 10.2 08/01/2016   HGB 11.0 (L) 08/01/2016   HCT 32.5 (L) 08/01/2016   MCV 86.4 08/01/2016   PLT 257 08/01/2016   No results found for: INR, PROTIME   Anesthesia Physical Anesthesia Plan  ASA: II  Anesthesia Plan: Epidural   Post-op Pain Management:    Induction:   Airway Management Planned:   Additional Equipment:   Intra-op Plan:   Post-operative Plan:   Informed Consent: I have reviewed the patients History and Physical, chart, labs and discussed the procedure including the risks, benefits and alternatives for the proposed anesthesia with the patient or authorized representative who has indicated his/her understanding and acceptance.     Plan Discussed with:   Anesthesia Plan Comments: (I have discussed risks of neuraxial anesthesia including but not limited to infection, bleeding, nerve injury, back pain, headache, seizures, and failure of block. Patient denies bleeding disorders and is not currently anticoagulated. Labs have  been reviewed. Risks and benefits discussed. All patient's questions answered.   Hgb 11 Hct 32.5 Platelets 257)        Anesthesia Quick Evaluation

## 2016-08-02 ENCOUNTER — Encounter (HOSPITAL_COMMUNITY): Payer: Self-pay

## 2016-08-02 LAB — MAGNESIUM: MAGNESIUM: 4.4 mg/dL — AB (ref 1.7–2.4)

## 2016-08-02 LAB — COMPREHENSIVE METABOLIC PANEL
ALT: 14 U/L (ref 14–54)
AST: 25 U/L (ref 15–41)
Albumin: 1.9 g/dL — ABNORMAL LOW (ref 3.5–5.0)
Alkaline Phosphatase: 95 U/L (ref 38–126)
Anion gap: 6 (ref 5–15)
BILIRUBIN TOTAL: 0.8 mg/dL (ref 0.3–1.2)
CHLORIDE: 107 mmol/L (ref 101–111)
CO2: 21 mmol/L — ABNORMAL LOW (ref 22–32)
CREATININE: 0.6 mg/dL (ref 0.44–1.00)
Calcium: 6.5 mg/dL — ABNORMAL LOW (ref 8.9–10.3)
Glucose, Bld: 89 mg/dL (ref 65–99)
Potassium: 3.1 mmol/L — ABNORMAL LOW (ref 3.5–5.1)
Sodium: 134 mmol/L — ABNORMAL LOW (ref 135–145)
TOTAL PROTEIN: 4.4 g/dL — AB (ref 6.5–8.1)

## 2016-08-02 LAB — CBC
HCT: 28.1 % — ABNORMAL LOW (ref 36.0–46.0)
HEMOGLOBIN: 9.8 g/dL — AB (ref 12.0–15.0)
MCH: 29.1 pg (ref 26.0–34.0)
MCHC: 34.9 g/dL (ref 30.0–36.0)
MCV: 83.4 fL (ref 78.0–100.0)
PLATELETS: 213 10*3/uL (ref 150–400)
RBC: 3.37 MIL/uL — ABNORMAL LOW (ref 3.87–5.11)
RDW: 14.4 % (ref 11.5–15.5)
WBC: 16.3 10*3/uL — ABNORMAL HIGH (ref 4.0–10.5)

## 2016-08-02 LAB — TYPE AND SCREEN
ABO/RH(D): A POS
Antibody Screen: NEGATIVE

## 2016-08-02 MED ORDER — DIBUCAINE 1 % RE OINT: 1.0000 "application " | TOPICAL_OINTMENT | RECTAL | Status: DC | PRN

## 2016-08-02 MED ORDER — LACTATED RINGERS IV SOLN
INTRAVENOUS | Status: DC
  Administered 2016-08-02: 19:00:00 via INTRAVENOUS

## 2016-08-02 MED ORDER — OXYCODONE-ACETAMINOPHEN 5-325 MG PO TABS
1.0000 | ORAL_TABLET | ORAL | Status: DC | PRN
  Administered 2016-08-02 – 2016-08-03 (×2): 1 via ORAL
  Filled 2016-08-02 (×2): qty 1

## 2016-08-02 MED ORDER — FERROUS SULFATE 325 (65 FE) MG PO TABS
325.0000 mg | ORAL_TABLET | Freq: Two times a day (BID) | ORAL | Status: DC
  Administered 2016-08-02 – 2016-08-03 (×3): 325 mg via ORAL
  Filled 2016-08-02 (×3): qty 1

## 2016-08-02 MED ORDER — FUROSEMIDE 10 MG/ML IJ SOLN: 20.0000 mg | Freq: Once | INTRAMUSCULAR | Status: DC

## 2016-08-02 MED ORDER — ZOLPIDEM TARTRATE 5 MG PO TABS: 5.0000 mg | ORAL_TABLET | Freq: Every evening | ORAL | Status: DC | PRN

## 2016-08-02 MED ORDER — WITCH HAZEL-GLYCERIN EX PADS: 1.0000 "application " | MEDICATED_PAD | CUTANEOUS | Status: DC | PRN

## 2016-08-02 MED ORDER — ONDANSETRON HCL 4 MG PO TABS: 4.0000 mg | ORAL_TABLET | ORAL | Status: DC | PRN

## 2016-08-02 MED ORDER — ONDANSETRON HCL 4 MG/2ML IJ SOLN: 4.0000 mg | INTRAMUSCULAR | Status: DC | PRN

## 2016-08-02 MED ORDER — COCONUT OIL OIL: 1.0000 "application " | TOPICAL_OIL | Status: DC | PRN

## 2016-08-02 MED ORDER — POTASSIUM CHLORIDE CRYS ER 20 MEQ PO TBCR
40.0000 meq | EXTENDED_RELEASE_TABLET | Freq: Two times a day (BID) | ORAL | Status: DC
  Administered 2016-08-02 (×2): 40 meq via ORAL
  Filled 2016-08-02 (×3): qty 2

## 2016-08-02 MED ORDER — SENNOSIDES-DOCUSATE SODIUM 8.6-50 MG PO TABS
2.0000 | ORAL_TABLET | ORAL | Status: DC
  Administered 2016-08-03: 2 via ORAL
  Filled 2016-08-02: qty 2

## 2016-08-02 MED ORDER — PRENATAL MULTIVITAMIN CH
1.0000 | ORAL_TABLET | Freq: Every day | ORAL | Status: DC
  Administered 2016-08-02 – 2016-08-03 (×2): 1 via ORAL
  Filled 2016-08-02 (×2): qty 1

## 2016-08-02 MED ORDER — BENZOCAINE-MENTHOL 20-0.5 % EX AERO: 1.0000 "application " | INHALATION_SPRAY | CUTANEOUS | Status: DC | PRN

## 2016-08-02 MED ORDER — FUROSEMIDE 10 MG/ML IJ SOLN
20.0000 mg | INTRAMUSCULAR | Status: AC
  Administered 2016-08-02: 20 mg via INTRAVENOUS
  Filled 2016-08-02: qty 2

## 2016-08-02 MED ORDER — IBUPROFEN 600 MG PO TABS
600.0000 mg | ORAL_TABLET | Freq: Four times a day (QID) | ORAL | Status: DC
  Administered 2016-08-02 – 2016-08-03 (×6): 600 mg via ORAL
  Filled 2016-08-02 (×6): qty 1

## 2016-08-02 MED ORDER — DIPHENHYDRAMINE HCL 25 MG PO CAPS: 25.0000 mg | ORAL_CAPSULE | Freq: Four times a day (QID) | ORAL | Status: DC | PRN

## 2016-08-02 MED ORDER — SIMETHICONE 80 MG PO CHEW: 80.0000 mg | CHEWABLE_TABLET | ORAL | Status: DC | PRN

## 2016-08-02 MED ORDER — ACETAMINOPHEN 325 MG PO TABS
650.0000 mg | ORAL_TABLET | ORAL | Status: DC | PRN
  Administered 2016-08-02: 650 mg via ORAL
  Filled 2016-08-02: qty 2

## 2016-08-02 NOTE — Anesthesia Postprocedure Evaluation (Signed)
Anesthesia Post Note  Patient: Kelsey Mcguire  Procedure(s) Performed: * No procedures listed *  Patient location during evaluation: [facility] Anesthesia Type: Epidural Level of consciousness: awake and alert, oriented and patient cooperative Pain management: pain level controlled Vital Signs Assessment: post-procedure vital signs reviewed and stable Respiratory status: spontaneous breathing Cardiovascular status: stable Postop Assessment: no headache, epidural receding, patient able to bend at knees and no signs of nausea or vomiting Anesthetic complications: no Comments: Pain score 3.     Last Vitals:  Vitals:   08/02/16 0500 08/02/16 0700  BP: (!) 144/93 (!) 143/84  Pulse: 73 73  Resp: 18 18  Temp: 37.3 C 36.9 C    Last Pain:  Vitals:   08/02/16 0700  TempSrc: Oral  PainSc:    Pain Goal: Patients Stated Pain Goal: 3 (08/02/16 0541)               Rico Sheehan

## 2016-08-02 NOTE — Progress Notes (Signed)
Pt oob and voided since foley removal. Pt without complaints at this time. Pt caring for infant. Toya Smothers, RN

## 2016-08-02 NOTE — Progress Notes (Addendum)
PPD 1 SVD with right sulcus repair / PPH with 2unit PRBC given third stage management  S:  Reports feeling ok              Tolerating po/ no nausea or vomiting this am - some last night             Bleeding is light             Pain controlled withmotrin             Up ad lib / ambulatory / voiding QS  Newborn formula feeding   O:               VS: BP 137/76 (BP Location: Left Arm)   Pulse 78   Temp 99.6 F (37.6 C) (Oral)   Resp 18   Ht 5\' 10"  (1.778 m)   Wt 99.8 kg (220 lb)   LMP 11/07/2015 (Exact Date)   SpO2 97%   Breastfeeding? Unknown   BMI 31.57 kg/m   BP 143/84 - 141/81 - 144/93 - 139/85 LABS:  creatinine 0.60 LE: 25/14             Recent Labs  08/01/16 2315 08/02/16 0506  WBC 22.4* 16.3*  HGB 8.3* 9.8*  PLT 247 213               Blood type: --/--/A POS (08/24 2315)  Rubella: Immune (02/07 0000)                     I&O: negative 161ml              Physical Exam:             Alert and oriented X3  Lungs: Clear and unlabored  Heart: regular rate and rhythm / no mumurs  Abdomen: soft, non-tender, non-distended              Fundus: firm, non-tender, U-1  Perineum: no edema  Lochia: light  Extremities: 2+ pedal edema, no calf pain or tenderness    A: PPD # 1 SVD with right sulcus repair             Preeclampsia on magnesium sulfate - not diuresing yet             PPH third stage s/p transfusion 2 unit PRBC             ABL anemia  Doing well - stable status  P: Routine post partum orders  Repeat labs in am  Artelia Laroche CNM, MSN, [facility] 08/02/2016, 10:17 AM  Addendum. Hypokalemia P) on K+ supplement . IDA. Will cont iron supplement

## 2016-08-02 NOTE — Plan of Care (Signed)
Problem: Safety: Goal: Ability to remain free from injury will improve Outcome: Completed/Met Date Met: 08/02/16 Not dizzy when walking to bathroom and tolerates this well.  Problem: Health Behavior/Discharge Planning: Goal: Ability to manage health-related needs will improve Outcome: Completed/Met Date Met: 08/02/16 Discussed with patient and husband what kinds of symptoms need to be discussed with her MD and what these things mean.Discussed continued plan of care.  Problem: Pain Managment: Goal: General experience of comfort will improve Outcome: Completed/Met Date Met: 08/02/16 Good pain control with po Percocet per patient.  Problem: Physical Regulation: Goal: Ability to maintain clinical measurements within normal limits will improve Outcome: Progressing Patient remains on Magnesium protocol. Blood pressures continue to fluctuate.DTR's WNL at this time. Goal: Will remain free from infection Outcome: Completed/Met Date Met: 08/02/16 Afebrile.  Problem: Skin Integrity: Goal: Risk for impaired skin integrity will decrease Outcome: Completed/Met Date Met: 08/02/16 Patient moves and walks to bathroom without any difficulty.  Problem: Activity: Goal: Risk for activity intolerance will decrease Outcome: Completed/Met Date Met: 08/02/16 Walks in room and tolerates well.  Problem: Nutrition: Goal: Adequate nutrition will be maintained Outcome: Completed/Met Date Met: 08/02/16 Tolerates a Regular diet well.

## 2016-08-02 NOTE — Plan of Care (Signed)
Problem: Physical Regulation: Goal: Risk for medication side effects will decrease Outcome: Progressing Magnesium protocol continues.Patient states it makes her feel a little sluggish .  Problem: Pain Management: Goal: Relief or control of pain will improve Outcome: Completed/Met Date Met: 08/02/16 Good pain control on po Percocet per patient.  Problem: Nutritional: Goal: Dietary intake will improve Outcome: Completed/Met Date Met: 08/02/16 Patient tolerates Regular diet and fluids without difficulty. Goal: Mother's verbalization of comfort with breastfeeding process will improve Outcome: Not Applicable Date Met: 81/77/11 Formula feeding.  Problem: Urinary Elimination: Goal: Ability to reestablish a normal urinary elimination pattern will improve Outcome: Adequate for Discharge Voids qs clear yellow urine.

## 2016-08-03 ENCOUNTER — Encounter: Payer: Self-pay | Admitting: Family

## 2016-08-03 LAB — COMPREHENSIVE METABOLIC PANEL
ALT: 15 U/L (ref 14–54)
AST: 30 U/L (ref 15–41)
Albumin: 2.3 g/dL — ABNORMAL LOW (ref 3.5–5.0)
Alkaline Phosphatase: 99 U/L (ref 38–126)
Anion gap: 7 (ref 5–15)
BUN: 6 mg/dL (ref 6–20)
CO2: 23 mmol/L (ref 22–32)
Calcium: 7.2 mg/dL — ABNORMAL LOW (ref 8.9–10.3)
Chloride: 107 mmol/L (ref 101–111)
Creatinine, Ser: 0.7 mg/dL (ref 0.44–1.00)
GFR calc Af Amer: >60 mL/min (ref >60)
GFR calc non Af Amer: >60 mL/min (ref >60)
Glucose, Bld: 105 mg/dL — ABNORMAL HIGH (ref 65–99)
Potassium: 3.7 mmol/L (ref 3.5–5.1)
Sodium: 137 mmol/L (ref 135–145)
Total Bilirubin: 0.3 mg/dL (ref 0.3–1.2)
Total Protein: 5.3 g/dL — ABNORMAL LOW (ref 6.5–8.1)

## 2016-08-03 LAB — TYPE AND SCREEN
ABO/RH(D): A POS
ANTIBODY SCREEN: NEGATIVE
UNIT DIVISION: 0
UNIT DIVISION: 0
Unit division: 0
Unit division: 0

## 2016-08-03 LAB — CBC
HCT: 26.9 % — ABNORMAL LOW (ref 36.0–46.0)
Hemoglobin: 9.2 g/dL — ABNORMAL LOW (ref 12.0–15.0)
MCH: 28.5 pg (ref 26.0–34.0)
MCHC: 34.2 g/dL (ref 30.0–36.0)
MCV: 83.3 fL (ref 78.0–100.0)
Platelets: 211 10*3/uL (ref 150–400)
RBC: 3.23 MIL/uL — ABNORMAL LOW (ref 3.87–5.11)
RDW: 14.7 % (ref 11.5–15.5)
WBC: 12.3 10*3/uL — ABNORMAL HIGH (ref 4.0–10.5)

## 2016-08-03 MED ORDER — HYDROCHLOROTHIAZIDE 12.5 MG PO CAPS: 12.5000 mg | ORAL_CAPSULE | Freq: Every day | ORAL | 0 refills | Status: DC

## 2016-08-03 MED ORDER — BENZOCAINE-MENTHOL 20-0.5 % EX AERO: 1.0000 "application " | INHALATION_SPRAY | CUTANEOUS | Status: DC | PRN

## 2016-08-03 MED ORDER — HYDROCHLOROTHIAZIDE 12.5 MG PO CAPS
12.5000 mg | ORAL_CAPSULE | Freq: Every day | ORAL | Status: DC
  Administered 2016-08-03: 12.5 mg via ORAL
  Filled 2016-08-03 (×2): qty 1

## 2016-08-03 NOTE — Progress Notes (Addendum)
Patient ID: Kelsey Mcguire, female   DOB: [birth year], 35 y.o.   MRN: [id number]   SVD with right sulcus repair / PPH with 2unit PRBC given third stage management Post Partum Day #2           Information for the patient's newborn:  Jalyse, Meersman [id number]  female  Baby name: Lorre Nick Feeding: bottle  Subjective: No HA, SOB, CP, F/C, breast symptoms. Pain minimal. Normal vaginal bleeding, no clots.      Objective:  VS:  Vitals:   08/03/16 0300 08/03/16 0415 08/03/16 0528 08/03/16 1000  BP:   (!) 144/96 (!) 169/90  Pulse:   72 88  Resp: 15 18 18 18   Temp:   98.5 F (36.9 C) 98.1 F (36.7 C)  TempSrc:   Oral Oral  SpO2:   96% 99%  Weight:   103.9 kg (229 lb)   Height:         Intake/Output Summary (Last 24 hours) at 08/03/16 1259 Last data filed at 08/03/16 [id number]  Gross per 24 hour  Intake          1338.75 ml  Output             2450 ml  Net         -1111.25 ml       Recent Labs  08/02/16 0506 08/03/16 0513  WBC 16.3* 12.3*  HGB 9.8* 9.2*  HCT 28.1* 26.9*  PLT 213 211   Lab Results  Component Value Date   ALT 15 08/03/2016   AST 30 08/03/2016   ALKPHOS 99 08/03/2016   BILITOT 0.3 08/03/2016   Blood type: --/--/A POS (08/24 2315) Rubella: Immune (02/07 0000)    Physical Exam:  General: alert, cooperative, no distress and pale Uterine Fundus: firm Lochia: appropriate Perineum: repair inatct, edema trace DVT Evaluation: No evidence of DVT seen on physical exam. Calf/Ankle edema is present.    Assessment/Plan: PPD # 2 / 35 y.o., [id number] S/P:induced vaginal   Principal Problem:   Postpartum care following vaginal delivery (8/24)   -normal postpartum exam  -Continue current postpartum care  Active Problems:   Preeclampsia - off Mag Sulfate this AM  -BP stable 140-160-s/90's, no neural s/s, diuresing well, stable labs  -started on HCTZ to continue PP   PPH third stage s/p transfusion 2 unit PRBC   ABL anemia - stable  - continue oral Fe x  4-6 wks PP  Hypokalemia - resolved after supplementation     D/C home  BP check at home by [employer] RN  PEC precautions to patient  Dr. Garwin Brothers consulted and agrees   LOS: 4 days   Juliene Pina, CNM, MSN 08/03/2016, 12:59 PM

## 2016-08-03 NOTE — Progress Notes (Signed)
CSW met with pt to discuss c/s for her , hx of depression.  Pt confirmed diagnosis and states that she takes Prozac and Klonopin, prescribed to her by her psychiatrist, and receives therapy, as well.  Pt has discussed PPD with her MD, psychiatrist and therapist, pta, and is agreeable to f/u with her providers for changes in mood/behavior, post-d/c.  No other CSW needs noted.  CSW signing off, please re-consult as necessary.  Creta Levin, LCSW Weekend Coverage [phone]

## 2016-08-03 NOTE — Discharge Summary (Signed)
Obstetric Discharge Summary Reason for Admission: induction of labor and preeclampsia Prenatal Procedures: NST, Preeclampsia and ultrasound Intrapartum Procedures: spontaneous vaginal delivery, epidural, induction of labor, magnesium, cytotec Postpartum Procedures: transfusion 2 units PRBC Complications-Operative and Postpartum: PPH,  2nd degree perineal laceration Hemoglobin  Date Value Ref Range Status  08/03/2016 9.2 (L) 12.0 - 15.0 g/dL Final   HCT  Date Value Ref Range Status  08/03/2016 26.9 (L) 36.0 - 46.0 % Final    Physical Exam:  General: alert, cooperative and no distress Lochia: appropriate Uterine Fundus: firm Incision: healing well DVT Evaluation: No evidence of DVT seen on physical exam. Calf/Ankle edema is present.  Hospital course: pt was admitted for IOL due to preeclampsia. Pt was started on pitocin and IV magnesium. She was changed to oral cytotec. Pt was treated for h/a with vicodin, fioricet, IV benadry/decadron/reglan for final response. Amniotomy and return to pitocin augmentation. Pt also was found to have hypokalemia and was given potassium repletion. Postpartum hemorrhage occurred which was treated with rectal cytotec, pitocin and blood transfusion. Pt had good diuresing during labor and postpartum with magnesium. Pt was started on hctz due to hypertension. Hypokalemia resolved. Depression medication continued Discharge Diagnoses: Term Pregnancy-delivered, Preeclampsia - resolving, and  Acute blood loss anemia, depression, hypokalemia resolved  Discharge Information: Date: 08/03/2016 Activity: pelvic rest Diet: routine Medications: PNV, Ibuprofen, Iron and HCTZ. Continue prozac, ambien Condition: stable Instructions: refer to practice specific booklet Discharge to: home Follow-up Information    Jameah Rouser A, MD. Schedule an appointment as soon as possible for a visit in 6 week(s).   Specialty:  Obstetrics and Gynecology Why:  Postpartum  visit Contact information: [address] [address] [phone]         also f/u with  her psychiatrist  Newborn Data: Live born female "Lucy" Birth Weight: 7 lb 0.5 oz (3190 g) APGAR: 8, 9  Home with mother.  Juliene Pina, CNM 08/03/2016, 1:26 PM

## 2016-12-16 ENCOUNTER — Telehealth: Payer: Self-pay | Admitting: Internal Medicine

## 2016-12-16 NOTE — Telephone Encounter (Signed)
Pt lvm for rasheedah. States that she had missed your call and would like to continue care at our office. Please cb to schedule [phone]

## 2016-12-16 NOTE — Telephone Encounter (Signed)
Thank you :)

## 2017-02-07 IMAGING — CT CT ABD-PELV W/ CM
2 of 4 series · 15 of 46 positions shown, 17 images · IV contrast (omnipaque)
Comparison: None.

CLINICAL DATA: Left-sided abdominal flank pain for 1 week.

EXAM:
CT ABDOMEN AND PELVIS WITH CONTRAST
TECHNIQUE: Multidetector CT imaging of the abdomen and pelvis was performed
using the standard protocol following bolus administration of
intravenous contrast.
CONTRAST:  100mL OMNIPAQUE IOHEXOL 350 MG/ML SOLN

[Series 2: routine abd pel with · axial · 0.68mm/px · z∈[-983,-543]mm · 12 of 96 slices shown, 14 images]
[im 4/96  soft-tissue]
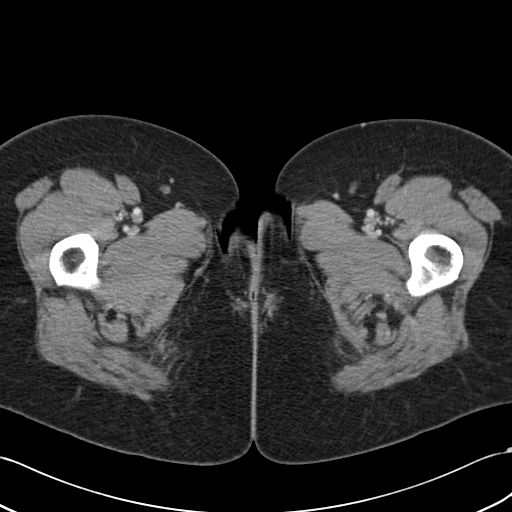
[im 4/96  bone]
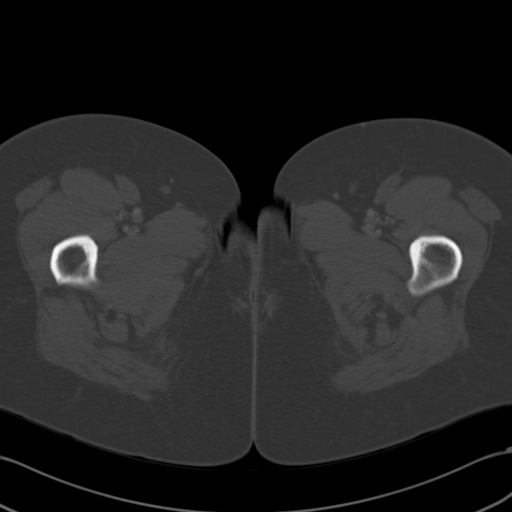
[im 12/96  soft-tissue]
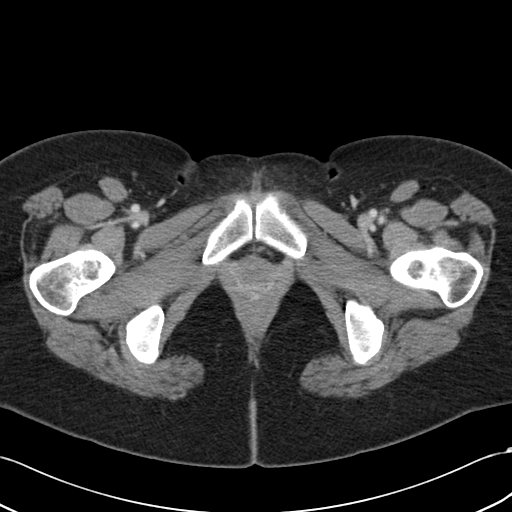
[im 20/96  soft-tissue]
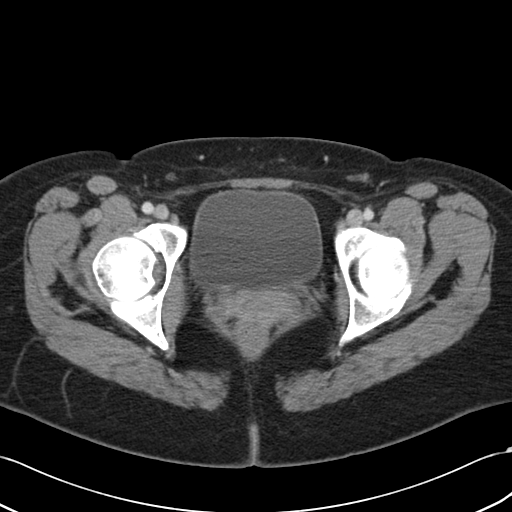
[im 28/96  soft-tissue]
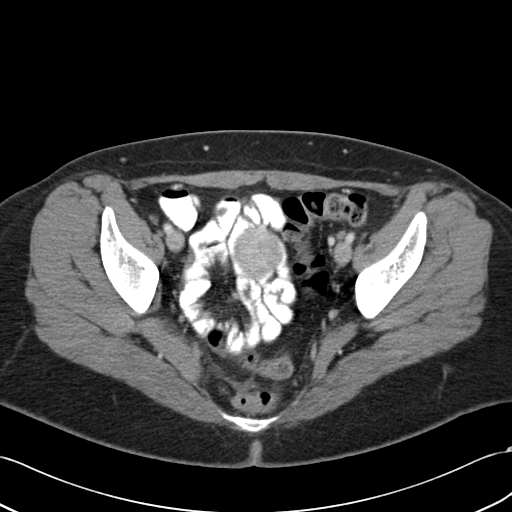
[im 36/96  soft-tissue]
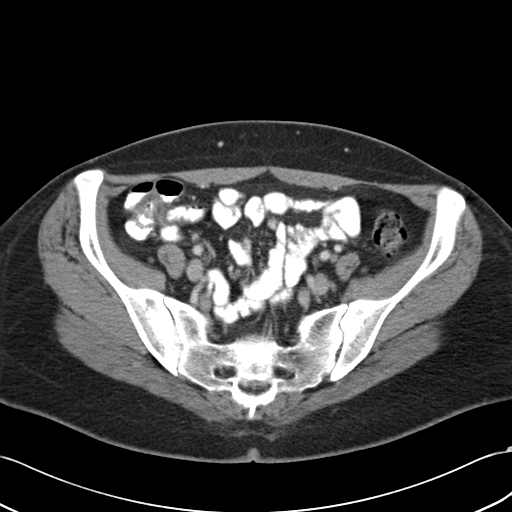
[im 44/96  soft-tissue]
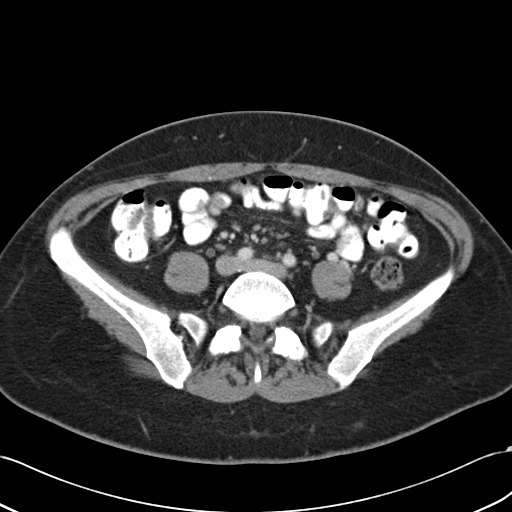
[im 52/96  soft-tissue]
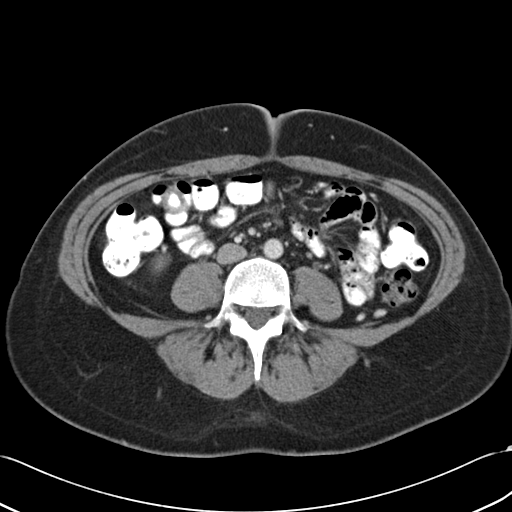
[im 60/96  soft-tissue]
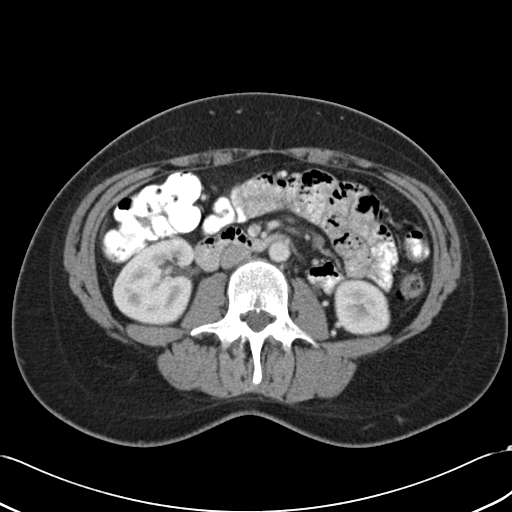
[im 68/96  soft-tissue]
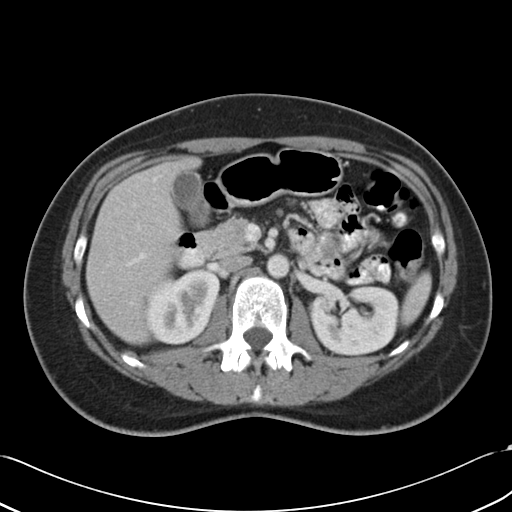
[im 68/96  bone]
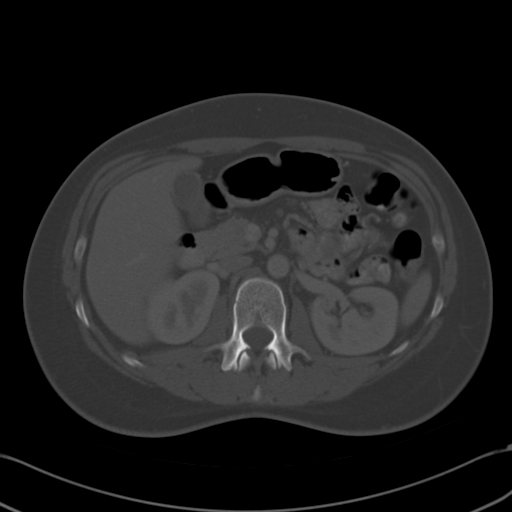
[im 76/96  soft-tissue]
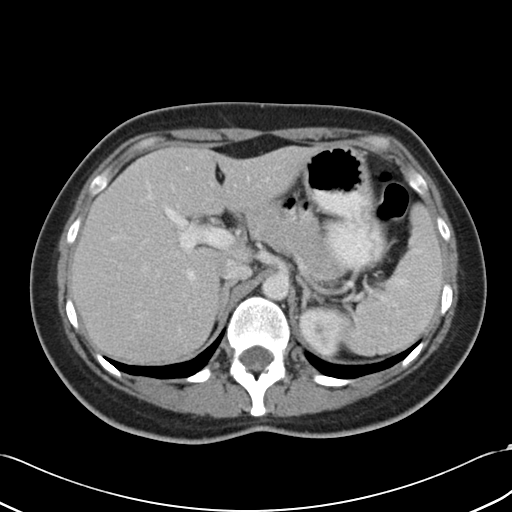
[im 84/96  soft-tissue]
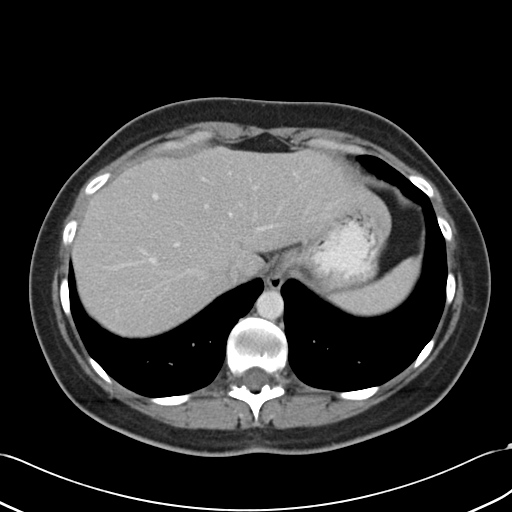
[im 92/96  soft-tissue]
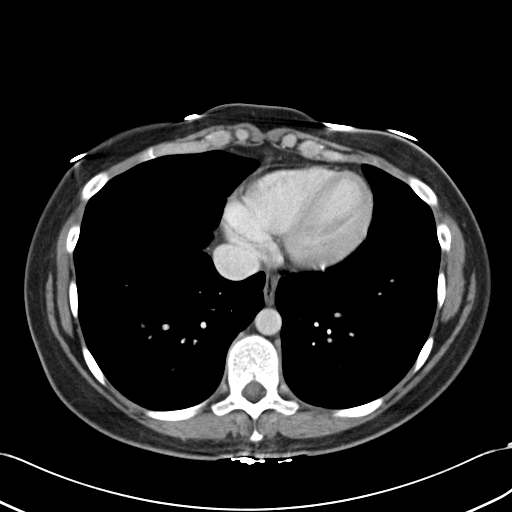

[Series 5: cor routine abd pel with · coronal · 0.63mm/px · 3 of 129 slices shown]
[im 43/129  soft-tissue]
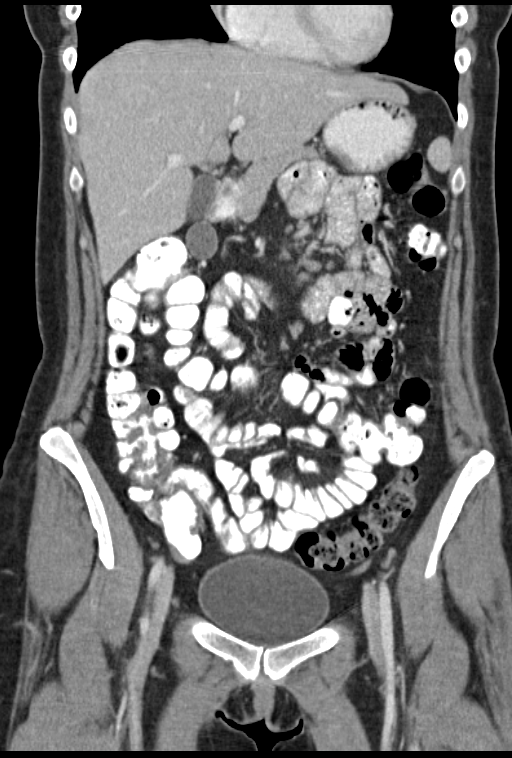
[im 57/129  soft-tissue]
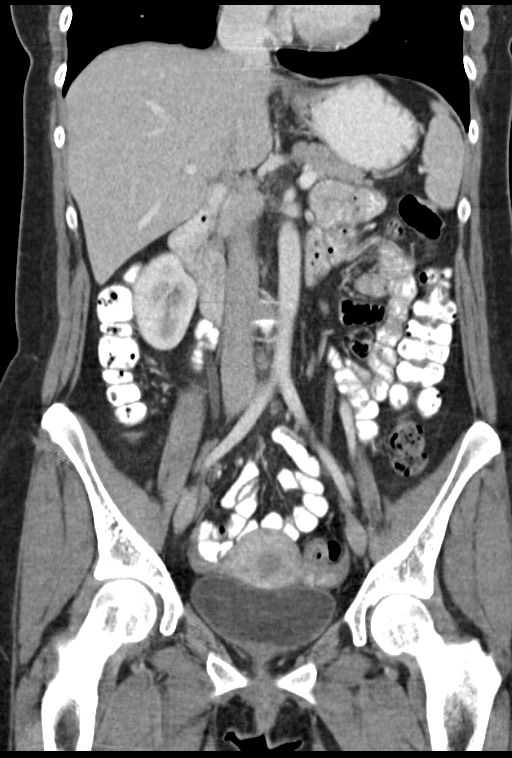
[im 72/129  soft-tissue]
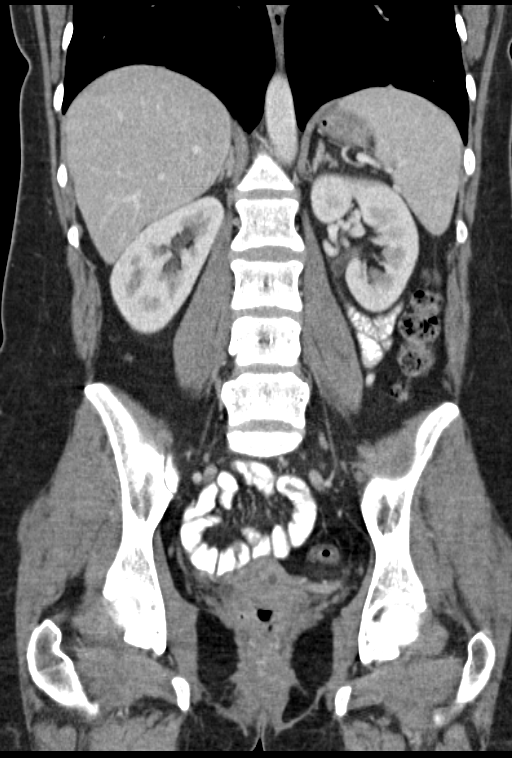

[15 of 46 positions shown; findings below may reference images not displayed]

FINDINGS: Lower Chest: No acute findings.

Hepatobiliary: No masses or other significant abnormality
identified. Gallbladder is unremarkable.

Pancreas: No mass, inflammatory changes, or other significant
abnormality identified.

Spleen:  Within normal limits in size and appearance.

Adrenals:  No masses identified.

Kidneys/Urinary Tract:  No evidence of masses or hydronephrosis.

Stomach/Bowel/Peritoneum: No evidence of wall thickening, mass, or
obstruction. Normal appendix visualized.

Vascular/Lymphatic: No pathologically enlarged lymph nodes
identified. No abdominal aortic aneurysm or other significant
retroperitoneal abnormality demonstrated.

Reproductive: 2 cm left ovarian corpus luteum cyst is noted, which
is a normal finding in reproductive age female. Trace amount of free
fluid noted in pelvic cul-de-sac, also likely physiologic. No mass
or other significant abnormality identified.

Other:  None.

Musculoskeletal:  No suspicious bone lesions identified.
IMPRESSION: 2 cm left ovarian corpus luteum, which is considered a normal
finding in a reproductive age female.

Otherwise negative exam.

## 2018-12-31 ENCOUNTER — Encounter: Payer: Self-pay | Admitting: Internal Medicine

## 2018-12-31 ENCOUNTER — Ambulatory Visit (INDEPENDENT_AMBULATORY_CARE_PROVIDER_SITE_OTHER): Payer: 59 | Admitting: Internal Medicine

## 2018-12-31 VITALS — BP 110/70 | HR 73 | Temp 98.0°F | Ht 70.0 in | Wt 199.8 lb

## 2018-12-31 DIAGNOSIS — G4700 Insomnia, unspecified: Secondary | ICD-10-CM | POA: Diagnosis not present

## 2018-12-31 DIAGNOSIS — R58 Hemorrhage, not elsewhere classified: Secondary | ICD-10-CM

## 2018-12-31 DIAGNOSIS — Z658 Other specified problems related to psychosocial circumstances: Secondary | ICD-10-CM

## 2018-12-31 DIAGNOSIS — E538 Deficiency of other specified B group vitamins: Secondary | ICD-10-CM

## 2018-12-31 DIAGNOSIS — D649 Anemia, unspecified: Secondary | ICD-10-CM

## 2018-12-31 DIAGNOSIS — Z1329 Encounter for screening for other suspected endocrine disorder: Secondary | ICD-10-CM

## 2018-12-31 DIAGNOSIS — Z0000 Encounter for general adult medical examination without abnormal findings: Secondary | ICD-10-CM

## 2018-12-31 DIAGNOSIS — E559 Vitamin D deficiency, unspecified: Secondary | ICD-10-CM

## 2018-12-31 DIAGNOSIS — Z0184 Encounter for antibody response examination: Secondary | ICD-10-CM

## 2018-12-31 DIAGNOSIS — R739 Hyperglycemia, unspecified: Secondary | ICD-10-CM

## 2018-12-31 DIAGNOSIS — Z1159 Encounter for screening for other viral diseases: Secondary | ICD-10-CM

## 2018-12-31 DIAGNOSIS — F952 Tourette's disorder: Secondary | ICD-10-CM

## 2018-12-31 DIAGNOSIS — N920 Excessive and frequent menstruation with regular cycle: Secondary | ICD-10-CM

## 2018-12-31 DIAGNOSIS — Z1389 Encounter for screening for other disorder: Secondary | ICD-10-CM

## 2018-12-31 DIAGNOSIS — Z8659 Personal history of other mental and behavioral disorders: Secondary | ICD-10-CM

## 2018-12-31 DIAGNOSIS — Z13220 Encounter for screening for lipoid disorders: Secondary | ICD-10-CM

## 2018-12-31 DIAGNOSIS — D509 Iron deficiency anemia, unspecified: Secondary | ICD-10-CM

## 2018-12-31 MED ORDER — ZOLPIDEM TARTRATE 10 MG PO TABS: 10.0000 mg | ORAL_TABLET | Freq: Every day | ORAL | 0 refills | Status: DC

## 2018-12-31 NOTE — Patient Instructions (Signed)
Menorrhagia    Menorrhagia is a condition in which menstrual periods are heavy or last longer than normal. With menorrhagia, most periods a woman has may cause enough blood loss and cramping that she becomes unable to take part in her usual activities.  What are the causes?  Common causes of this condition include:  · Noncancerous growths in the uterus (polyps or fibroids).  · An imbalance of the estrogen and progesterone hormones.  · One of the ovaries not releasing an egg during one or more months.  · A problem with the thyroid gland (hypothyroid).  · Side effects of having an intrauterine device (IUD).  · Side effects of some medicines, such as anti-inflammatory medicines or blood thinners.  · A bleeding disorder that stops the blood from clotting normally.  In some cases, the cause of this condition is not known.  What are the signs or symptoms?  Symptoms of this condition include:  · Routinely having to change your pad or tampon every 1-2 hours because it is completely soaked.  · Needing to use pads and tampons at the same time because of heavy bleeding.  · Needing to wake up to change your pads or tampons during the night.  · Passing blood clots larger than 1 inch (2.5 cm) in size.  · Having bleeding that lasts for more than 7 days.  · Having symptoms of low iron levels (anemia), such as tiredness, fatigue, or shortness of breath.  How is this diagnosed?  This condition may be diagnosed based on:  · A physical exam.  · Your symptoms and menstrual history.  · Tests, such as:  ? Blood tests to check if you are pregnant or have hormonal changes, a bleeding or thyroid disorder, anemia, or other problems.  ? Pap test to check for cancerous changes, infections, or inflammation.  ? Endometrial biopsy. This test involves removing a tissue sample from the lining of the uterus (endometrium) to be examined under a microscope.  ? Pelvic ultrasound. This test uses sound waves to create images of your uterus, ovaries, and  vagina. The images can show if you have fibroids or other growths.  ? Hysteroscopy. For this test, a small telescope is used to look inside your uterus.  How is this treated?  Treatment may not be needed for this condition. If it is needed, the best treatment for you will depend on:  · Whether you need to prevent pregnancy.  · Your desire to have children in the future.  · The cause and severity of your bleeding.  · Your personal preference.  Medicines are the first step in treatment. You may be treated with:  · Hormonal birth control methods. These treatments reduce bleeding during your menstrual period. They include:  ? Birth control pills.  ? Skin patch.  ? Vaginal ring.  ? Shots (injections) that you get every 3 months.  ? Hormonal IUD (intrauterine device).  ? Implants that go under the skin.  · Medicines that thicken blood and slow bleeding.  · Medicines that reduce swelling, such as ibuprofen.  · Medicines that contain an artificial (synthetic) hormone called progestin.  · Medicines that make the ovaries stop working for a short time.  · Iron supplements to treat anemia.  If medicines do not work, surgery may be done. Surgical options may include:  · Dilation and curettage (D&C). In this procedure, your health care provider opens (dilates) your cervix and then scrapes or suctions tissue from the endometrium to   reduce menstrual bleeding.  · Operative hysteroscopy. In this procedure, a small tube with a light on the end (hysteroscope) is used to view your uterus and help remove polyps that may be causing heavy periods.  · Endometrial ablation. This is when various techniques are used to permanently destroy your entire endometrium. After endometrial ablation, most women have little or no menstrual flow. This procedure reduces your ability to become pregnant.  · Endometrial resection. In this procedure, an electrosurgical wire loop is used to remove the endometrium. This procedure reduces your ability to become  pregnant.  · Hysterectomy. This is surgical removal of the uterus. This is a permanent procedure that stops menstrual periods. Pregnancy is not possible after a hysterectomy.  Follow these instructions at home:  Medicines  · Take over-the-counter and prescription medicines exactly as told by your health care provider. This includes iron pills.  · Do not change or switch medicines without asking your health care provider.  · Do not take aspirin or medicines that contain aspirin 1 week before or during your menstrual period. Aspirin may make bleeding worse.  General instructions  · If you need to change your sanitary pad or tampon more than once every 2 hours, limit your activity until the bleeding stops.  · Iron pills can cause constipation. To prevent or treat constipation while you are taking prescription iron supplements, your health care provider may recommend that you:  ? Drink enough fluid to keep your urine clear or pale yellow.  ? Take over-the-counter or prescription medicines.  ? Eat foods that are high in fiber, such as fresh fruits and vegetables, whole grains, and beans.  ? Limit foods that are high in fat and processed sugars, such as fried and sweet foods.  · Eat well-balanced meals, including foods that are high in iron. Foods that have a lot of iron include leafy green vegetables, meat, liver, eggs, and whole grain breads and cereals.  · Do not try to lose weight until the abnormal bleeding has stopped and your blood iron level is back to normal. If you need to lose weight, work with your health care provider to lose weight safely.  · Keep all follow-up visits as told by your health care provider. This is important.  Contact a health care provider if:  · You soak through a pad or tampon every 1 or 2 hours, and this happens every time you have a period.  · You need to use pads and tampons at the same time because you are bleeding so much.  · You have nausea, vomiting, diarrhea, or other problems  related to medicines you are taking.  Get help right away if:  · You soak through more than a pad or tampon in 1 hour.  · You pass clots bigger than 1 inch (2.5 cm) wide.  · You feel short of breath.  · You feel like your heart is beating too fast.  · You feel dizzy or faint.  · You feel very weak or tired.  Summary  · Menorrhagia is a condition in which menstrual periods are heavy or last longer than normal.  · Treatment will depend on the cause of the condition and may include medicines or procedures.  · Take over-the-counter and prescription medicines exactly as told by your health care provider. This includes iron pills.  · Get help right away if you have heavy bleeding that soaks through more than a pad or tampon in 1 hour, you are passing   large clots, or you feel dizzy, faint or short of breath.  This information is not intended to replace advice given to you by your health care provider. Make sure you discuss any questions you have with your health care provider.  Document Released: 11/25/2005 Document Revised: 11/18/2016 Document Reviewed: 11/18/2016  Elsevier Interactive Patient Education © 2019 [employer]

## 2018-12-31 NOTE — Progress Notes (Signed)
Chief Complaint  Patient presents with  . transfer of care   TOC former Dr. Gilford Rile pt  1. Chronic insomnia was on ambein 10 mg qhs f/u psych Dr. Quay Burow in [facility] advised pt will fill temp supply ambien 10 rec dose for females is 5 mg qhs rec f/u with psych and disc trazadone, lunesta, belsombra, restoril. She wants to avoid benzos has weaned off klonopin  2. Heavy menses and bleeding gums which concerns her with chronic iron def anemia needs labs  3. H/o tourettes, depression and panic mood d/o controlled for now  4.She reports she is vegetarian now vegan x 7-8 years   Review of Systems  Constitutional: Positive for malaise/fatigue. Negative for weight loss.  HENT: Negative for hearing loss.   Eyes: Negative for blurred vision.  Respiratory: Negative for shortness of breath.   Cardiovascular: Negative for chest pain.  Gastrointestinal: Negative for abdominal pain.  Genitourinary:       Heavy menes  Musculoskeletal: Negative for falls.  Skin: Negative for rash.  Endo/Heme/Allergies: Bruises/bleeds easily.  Psychiatric/Behavioral: Negative for depression. The patient has insomnia. The patient is not nervous/anxious.    Past Medical History:  Diagnosis Date  . Anemia   . IUD complication ([place])    spontaneously came out while on honeymoon  . Migraine   . Sinus infection   . Tourette syndrome   . Urinary tract infection    Past Surgical History:  Procedure Laterality Date  . WISDOM TOOTH EXTRACTION     Family History  Problem Relation Age of Onset  . Depression Mother   . Anxiety disorder Mother   . Heart disease Father   . Hypertension Father   . Diabetes Father   . Tourette syndrome Father    Social History   Socioeconomic History  . Marital status: Single    Spouse name: Not on file  . Number of children: Not on file  . Years of education: Not on file  . Highest education level: Not on file  Occupational History  . Not on file  Social Needs  . Financial resource  strain: Not on file  . Food insecurity:    Worry: Not on file    Inability: Not on file  . Transportation needs:    Medical: Not on file    Non-medical: Not on file  Tobacco Use  . Smoking status: Former Research scientist (life sciences)  . Smokeless tobacco: Current User  . Tobacco comment: E-cigarette  Substance and Sexual Activity  . Alcohol use: No  . Drug use: Not on file  . Sexual activity: Not on file  Lifestyle  . Physical activity:    Days per week: Not on file    Minutes per session: Not on file  . Stress: Not on file  Relationships  . Social connections:    Talks on phone: Not on file    Gets together: Not on file    Attends religious service: Not on file    Active member of club or organization: Not on file    Attends meetings of clubs or organizations: Not on file    Relationship status: Not on file  . Intimate partner violence:    Fear of current or ex partner: Not on file    Emotionally abused: Not on file    Physically abused: Not on file    Forced sexual activity: Not on file  Other Topics Concern  . Not on file  Social History Narrative   Lives in [place].  Works - [employer]      Diet - Vegetarian   Current Meds  Medication Sig  . ferrous sulfate 325 (65 FE) MG tablet Take by mouth daily.  . vitamin B-12 (CYANOCOBALAMIN) 1000 MCG tablet Take 1,000 mcg by mouth daily.  Marland Kitchen zolpidem (AMBIEN) 10 MG tablet Take 10 mg by mouth at bedtime.   . [DISCONTINUED] acetaminophen (TYLENOL) 325 MG tablet Take 650 mg by mouth every 6 (six) hours as needed for mild pain.  . [DISCONTINUED] benzocaine-Menthol (DERMOPLAST) 20-0.5 % AERO Apply 1 application topically as needed for irritation (perineal discomfort).  . [DISCONTINUED] FLUoxetine (PROZAC) 10 MG capsule Take 10 mg by mouth daily.  . [DISCONTINUED] hydrochlorothiazide (MICROZIDE) 12.5 MG capsule Take 1 capsule (12.5 mg total) by mouth daily.  . [DISCONTINUED] Prenatal Vit-Fe Fumarate-FA (PRENATAL VITAMINS PLUS) 27-1 MG  TABS Take 1 tablet by mouth daily.   Allergies  Allergen Reactions  . Augmentin [Amoxicillin-Pot Clavulanate] Nausea Only   No results found for this or any previous visit (from the past 2160 hour(s)). Objective  Body mass index is 28.67 kg/m. Wt Readings from Last 3 Encounters:  12/31/18 199 lb 12.8 oz (90.6 kg)  08/03/16 229 lb (103.9 kg)  04/09/16 203 lb 6.4 oz (92.3 kg)   Temp Readings from Last 3 Encounters:  12/31/18 98 F (36.7 C) (Oral)  08/03/16 98.8 F (37.1 C) (Oral)  04/09/16 98.8 F (37.1 C)   BP Readings from Last 3 Encounters:  12/31/18 110/70  08/03/16 (!) 145/95  04/09/16 120/80   Pulse Readings from Last 3 Encounters:  12/31/18 73  08/03/16 84  04/09/16 90    Physical Exam Vitals signs and nursing note reviewed.  Constitutional:      Appearance: Normal appearance. She is well-developed, well-groomed and overweight.  HENT:     Head: Normocephalic and atraumatic.     Nose: Nose normal.     Mouth/Throat:     Mouth: Mucous membranes are moist.     Pharynx: Oropharynx is clear.  Eyes:     Conjunctiva/sclera: Conjunctivae normal.     Pupils: Pupils are equal, round, and reactive to light.  Cardiovascular:     Rate and Rhythm: Normal rate and regular rhythm.     Pulses: Normal pulses.     Heart sounds: Normal heart sounds. No murmur.  Pulmonary:     Effort: Pulmonary effort is normal.     Breath sounds: Normal breath sounds.  Skin:    General: Skin is warm and dry.  Neurological:     General: No focal deficit present.     Mental Status: She is alert and oriented to person, place, and time. Mental status is at baseline.     Gait: Gait normal.  Psychiatric:        Attention and Perception: Attention and perception normal.        Mood and Affect: Mood and affect normal.        Speech: Speech normal.        Behavior: Behavior normal. Behavior is cooperative.        Thought Content: Thought content normal.        Cognition and Memory: Cognition  and memory normal.        Judgment: Judgment normal.     Assessment   1. Chronic insomnia  2. Heavy menses/bleeding in excess, anemia with iron def  3. HM Plan   1. Disc other options pt to review side effects  rec f/u psych chronic sleep  issues ambien 10 mg qhs temp refill  2. Refer to Dr. Mike Gip  3.  Flu shot utd  Check ob/gyn Tdap Dr. Garwin Brothers  Pap from 10/2017 Dr. Garwin Brothers, will do pap at f/u h/o abnormal and HPV in 2012  Fasting labs to sch    Provider: Dr. Olivia Mackie McLean-Scocuz[facility]

## 2019-01-01 ENCOUNTER — Encounter: Payer: Self-pay | Admitting: Internal Medicine

## 2019-01-01 DIAGNOSIS — Z8659 Personal history of other mental and behavioral disorders: Secondary | ICD-10-CM | POA: Insufficient documentation

## 2019-01-01 DIAGNOSIS — Z658 Other specified problems related to psychosocial circumstances: Secondary | ICD-10-CM | POA: Insufficient documentation

## 2019-01-08 ENCOUNTER — Other Ambulatory Visit: Payer: Self-pay | Admitting: Internal Medicine

## 2019-01-08 ENCOUNTER — Other Ambulatory Visit (INDEPENDENT_AMBULATORY_CARE_PROVIDER_SITE_OTHER): Payer: 59

## 2019-01-08 DIAGNOSIS — Z13220 Encounter for screening for lipoid disorders: Secondary | ICD-10-CM

## 2019-01-08 DIAGNOSIS — R739 Hyperglycemia, unspecified: Secondary | ICD-10-CM

## 2019-01-08 DIAGNOSIS — E538 Deficiency of other specified B group vitamins: Secondary | ICD-10-CM

## 2019-01-08 DIAGNOSIS — Z0000 Encounter for general adult medical examination without abnormal findings: Secondary | ICD-10-CM

## 2019-01-08 DIAGNOSIS — E559 Vitamin D deficiency, unspecified: Secondary | ICD-10-CM

## 2019-01-08 DIAGNOSIS — Z1159 Encounter for screening for other viral diseases: Secondary | ICD-10-CM

## 2019-01-08 DIAGNOSIS — Z0184 Encounter for antibody response examination: Secondary | ICD-10-CM

## 2019-01-08 DIAGNOSIS — D649 Anemia, unspecified: Secondary | ICD-10-CM

## 2019-01-08 DIAGNOSIS — Z1389 Encounter for screening for other disorder: Secondary | ICD-10-CM

## 2019-01-08 DIAGNOSIS — R58 Hemorrhage, not elsewhere classified: Secondary | ICD-10-CM | POA: Diagnosis not present

## 2019-01-08 DIAGNOSIS — Z1329 Encounter for screening for other suspected endocrine disorder: Secondary | ICD-10-CM | POA: Diagnosis not present

## 2019-01-08 LAB — CBC WITH DIFFERENTIAL/PLATELET
Basophils Absolute: 0 10*3/uL (ref 0.0–0.1)
Basophils Relative: 0.8 % (ref 0.0–3.0)
Eosinophils Absolute: 0.3 10*3/uL (ref 0.0–0.7)
Eosinophils Relative: 5 % (ref 0.0–5.0)
HCT: 39.9 % (ref 36.0–46.0)
Hemoglobin: 13.4 g/dL (ref 12.0–15.0)
Lymphocytes Relative: 32.8 % (ref 12.0–46.0)
Lymphs Abs: 1.7 10*3/uL (ref 0.7–4.0)
MCHC: 33.5 g/dL (ref 30.0–36.0)
MCV: 90.4 fl (ref 78.0–100.0)
Monocytes Absolute: 0.3 10*3/uL (ref 0.1–1.0)
Monocytes Relative: 6.7 % (ref 3.0–12.0)
NEUTROS ABS: 2.8 10*3/uL (ref 1.4–7.7)
NEUTROS PCT: 54.7 % (ref 43.0–77.0)
PLATELETS: 281 10*3/uL (ref 150.0–400.0)
RBC: 4.42 Mil/uL (ref 3.87–5.11)
RDW: 12.7 % (ref 11.5–15.5)
WBC: 5.1 10*3/uL (ref 4.0–10.5)

## 2019-01-08 LAB — COMPREHENSIVE METABOLIC PANEL
ALT: 12 U/L (ref 0–35)
AST: 16 U/L (ref 0–37)
Albumin: 4 g/dL (ref 3.5–5.2)
Alkaline Phosphatase: 65 U/L (ref 39–117)
BUN: 7 mg/dL (ref 6–23)
CO2: 25 mEq/L (ref 19–32)
Calcium: 9.4 mg/dL (ref 8.4–10.5)
Chloride: 103 mEq/L (ref 96–112)
Creatinine, Ser: 0.6 mg/dL (ref 0.40–1.20)
GFR: 112 mL/min (ref >60.00)
Glucose, Bld: 102 mg/dL — ABNORMAL HIGH (ref 70–99)
Potassium: 4.2 mEq/L (ref 3.5–5.1)
Sodium: 136 mEq/L (ref 135–145)
Total Bilirubin: 0.5 mg/dL (ref 0.2–1.2)
Total Protein: 6.8 g/dL (ref 6.0–8.3)

## 2019-01-08 LAB — URINALYSIS, ROUTINE W REFLEX MICROSCOPIC
Bacteria, UA: NONE SEEN /HPF
Bilirubin Urine: NEGATIVE
Glucose, UA: NEGATIVE
HYALINE CAST: NONE SEEN /LPF
Hgb urine dipstick: NEGATIVE
Nitrite: NEGATIVE
Protein, ur: NEGATIVE
Specific Gravity, Urine: 1.019 (ref 1.001–1.03)
pH: 7 (ref 5.0–8.0)

## 2019-01-08 LAB — HEMOGLOBIN A1C: Hgb A1c MFr Bld: 5.3 % (ref 4.6–6.5)

## 2019-01-08 LAB — LIPID PANEL
Cholesterol: 141 mg/dL (ref 0–200)
HDL: 47.3 mg/dL (ref >39.00)
LDL Cholesterol: 79 mg/dL (ref 0–99)
NonHDL: 94.14
Total CHOL/HDL Ratio: 3
Triglycerides: 76 mg/dL (ref 0.0–149.0)
VLDL: 15.2 mg/dL (ref 0.0–40.0)

## 2019-01-08 LAB — VITAMIN B12: Vitamin B-12: 660 pg/mL (ref 211–911)

## 2019-01-08 LAB — T4, FREE: Free T4: 0.92 ng/dL (ref 0.60–1.60)

## 2019-01-08 LAB — VITAMIN D 25 HYDROXY (VIT D DEFICIENCY, FRACTURES): VITD: 15.66 ng/mL — ABNORMAL LOW (ref 30.00–100.00)

## 2019-01-08 LAB — TSH: TSH: 1.66 u[IU]/mL (ref 0.35–4.50)

## 2019-01-08 MED ORDER — CHOLECALCIFEROL 1.25 MG (50000 UT) PO CAPS: 50000.0000 [IU] | ORAL_CAPSULE | ORAL | 1 refills | Status: DC

## 2019-01-09 ENCOUNTER — Encounter: Payer: Self-pay | Admitting: Internal Medicine

## 2019-01-11 LAB — MEASLES/MUMPS/RUBELLA IMMUNITY
Mumps IgG: 13.7 AU/mL
RUBEOLA IGG: 91.9 [AU]/ml
Rubella: 3.9 index

## 2019-01-11 LAB — IRON,TIBC AND FERRITIN PANEL
%SAT: 13 % (calc) — ABNORMAL LOW (ref 16–45)
Ferritin: 32 ng/mL (ref 16–154)
Iron: 46 ug/dL (ref 40–190)
TIBC: 343 mcg/dL (calc) (ref 250–450)

## 2019-01-11 LAB — HEPATITIS B SURFACE ANTIBODY, QUANTITATIVE

## 2019-01-17 NOTE — Progress Notes (Signed)
[facility] day:  01/18/2019  Chief Complaint: Kelsey Mcguire is a 38 y.o. female with menorrhagia who is referred in consultation by Dr. Olivia Mackie McLean-Scocuzza for assessment and management  HPI:  The patient states that she is vegetarian, now vegan x 7-8 years.  She has not eaten meat for 8 years.  She notes anemia before her diet changed. She has a history of heavy menses and bleeding gums.  She has chronic iron deficiency anemia.   She was admitted to [facility] from 07/30/2016 - 08/03/2016.  She was admitted for induction of labor due to pre-eclampsia.  She had a postpartum hemorrhage which was treated with Cytotec, Pitocin and 2 units of PRBCs.  Admission hemoglobin was 10.7.  Hemoglobin on 08/01/2016 was 8.3.  Discharge hemoglobin was 9.2.  CBC on 01/08/2018 revealed a hematocrit of 39.9, hemoglobin 13.4, MCV 90.4, platelets 281,000, WBC 5100 with an ANC of 2800.  Ferritin was 32 with an iron saturation of 13% and a TIBC of 343.  B12 was 660.  Creatinine was 0.60.  LFTs were normal. TSH was 1.66 with free T4 of 0.92.  Symptomatically, patient has heavy menses since she was a teenager.  Menses generally last for 7-10 days. She does not routinely use NSAID products; last was 3 weeks ago. Patient confirms that she has bruised easily throughout her life. She confirms gingival bleeding. No recurrent epistaxis events. Paternal females in family have issues with abnormal menstrual cycles.   She notes wisdom teeth extraction in college without excess bleeding.    She has frequent sinus related headaches. No cough, shortness of breath, or chest pain. No abdominal pain or changes to her bowel habits. She denies any obvious GI related bleeding. Patient complains of fatigue and generalized weakness. She states, "I get really weak, tired, and lethargic". She has been on oral ferous sulfate 325 mg for 6-7 years. She has intermittently taken supplemental B12 for 6-7  years as well. She is currently on weekly Drisdol therapy.   Patient advises that she maintains an adequate appetite. She is eating well. She has transitioned from vegetarian to a predominantly vegan diet over the last 8 years. Weight today is 197 lb 13.8 oz (89.8 kg).  Patient denies pain in the clinic today.   Past Medical History:  Diagnosis Date  . Anemia   . IUD complication ([place])    spontaneously came out while on honeymoon  . Migraine   . Sinus infection   . Tourette syndrome   . Urinary tract infection     Past Surgical History:  Procedure Laterality Date  . WISDOM TOOTH EXTRACTION      Family History  Problem Relation Age of Onset  . Depression Mother   . Anxiety disorder Mother   . Heart disease Father   . Hypertension Father   . Diabetes Father   . Tourette syndrome Father     Social History:  reports that she has quit smoking. She has never used smokeless tobacco. She reports that she does not drink alcohol. No history on file for drug.  She lives in [place].  The patient is alone today.  Allergies:  Allergies  Allergen Reactions  . Augmentin [Amoxicillin-Pot Clavulanate] Nausea Only    Current Medications: Current Outpatient Medications  Medication Sig Dispense Refill  . Cholecalciferol 1.25 MG (50000 UT) capsule Take 1 capsule (50,000 Units total) by mouth once a week. 13 capsule 1  . ferrous sulfate 325 (65  FE) MG tablet Take by mouth daily.    . vitamin B-12 (CYANOCOBALAMIN) 1000 MCG tablet Take 1,000 mcg by mouth daily.    Marland Kitchen zolpidem (AMBIEN) 10 MG tablet Take 1 tablet (10 mg total) by mouth at bedtime. prn (Patient not taking: Reported on 01/18/2019) 90 tablet 0   No current facility-administered medications for this visit.     Review of Systems:  GENERAL:  Feels tired and lethargic.  Feels "weak when iron is low".  No fevers, sweats or weight loss. PERFORMANCE STATUS (ECOG):  1 HEENT:  Gingival bleeding.  No epistaxis.  No visual changes,  runny nose, sore throat, mouth sores or tenderness. Lungs:  No shortness of breath or cough.  No hemoptysis. Cardiac:  No chest pain, palpitations, orthopnea, or PND. GI:  No nausea, vomiting, diarrhea, constipation, melena or hematochezia. GU:  No urgency, frequency, dysuria, or hematuria.  Heavy menses.   Musculoskeletal:  No back pain.  No joint pain.  No muscle tenderness. Extremities:  No pain or swelling. Skin:  Patchy dry skin in the winter.  Bruises easily.  No rashes or skin changes. Neuro:  Off and on headaches related to sinuses.  Nonumbness or weakness, balance or coordination issues. Endocrine:  No diabetes, thyroid issues, hot flashes or night sweats. Psych:  No mood changes, depression or anxiety. Pain:  No focal pain. Review of systems:  All other systems reviewed and found to be negative.  Physical Exam: Blood pressure 115/71, pulse 75, temperature 99.6 F (37.6 C), temperature source Tympanic, resp. rate 18, height 5\' 10"  (1.778 m), weight 197 lb 13.8 oz (89.8 kg), last menstrual period 12/25/2018, SpO2 100 %, unknown if currently breastfeeding. GENERAL:  Well developed, well nourished, **man sitting comfortably in the exam room in no acute distress. MENTAL STATUS:  Alert and oriented to person, place and time. HEAD:  Brown hair pulled up.  Normocephalic, atraumatic, face symmetric, no Cushingoid features. EYES:  Brown eyes.  Pupils equal round and reactive to light and accomodation.  No conjunctivitis or scleral icterus. ENT:  Oropharynx clear without lesion.  Tongue normal. Mucous membranes moist.  RESPIRATORY:  Clear to auscultation without rales, wheezes or rhonchi. CARDIOVASCULAR:  Regular rate and rhythm without murmur, rub or gallop. ABDOMEN:  Soft, non-tender, with active bowel sounds, and no hepatosplenomegaly.  No masses. SKIN:  No rashes, ulcers or lesions. EXTREMITIES: No edema, no skin discoloration or tenderness.  No palpable cords. LYMPH NODES: No palpable  cervical, supraclavicular, axillary or inguinal adenopathy  NEUROLOGICAL: Unremarkable. PSYCH:  Appropriate.   No visits with results within 3 Day(s) from this visit.  Latest known visit with results is:  Lab on 01/08/2019  Component Date Value Ref Range Status  . Sodium 01/08/2019 136  135 - 145 mEq/L Final  . Potassium 01/08/2019 4.2  3.5 - 5.1 mEq/L Final  . Chloride 01/08/2019 103  96 - 112 mEq/L Final  . CO2 01/08/2019 25  19 - 32 mEq/L Final  . Glucose, Bld 01/08/2019 102* 70 - 99 mg/dL Final  . BUN 01/08/2019 7  6 - 23 mg/dL Final  . Creatinine, Ser 01/08/2019 0.60  0.40 - 1.20 mg/dL Final  . Total Bilirubin 01/08/2019 0.5  0.2 - 1.2 mg/dL Final  . Alkaline Phosphatase 01/08/2019 65  39 - 117 U/L Final  . AST 01/08/2019 16  0 - 37 U/L Final  . ALT 01/08/2019 12  0 - 35 U/L Final  . Total Protein 01/08/2019 6.8  6.0 - 8.3 g/dL  Final  . Albumin 01/08/2019 4.0  3.5 - 5.2 g/dL Final  . Calcium 01/08/2019 9.4  8.4 - 10.5 mg/dL Final  . GFR 01/08/2019 112.00  >60.00 mL/min Final  . WBC 01/08/2019 5.1  4.0 - 10.5 K/uL Final  . RBC 01/08/2019 4.42  3.87 - 5.11 Mil/uL Final  . Hemoglobin 01/08/2019 13.4  12.0 - 15.0 g/dL Final  . HCT 01/08/2019 39.9  36.0 - 46.0 % Final  . MCV 01/08/2019 90.4  78.0 - 100.0 fl Final  . MCHC 01/08/2019 33.5  30.0 - 36.0 g/dL Final  . RDW 01/08/2019 12.7  11.5 - 15.5 % Final  . Platelets 01/08/2019 281.0  150.0 - 400.0 K/uL Final  . Neutrophils Relative % 01/08/2019 54.7  43.0 - 77.0 % Final  . Lymphocytes Relative 01/08/2019 32.8  12.0 - 46.0 % Final  . Monocytes Relative 01/08/2019 6.7  3.0 - 12.0 % Final  . Eosinophils Relative 01/08/2019 5.0  0.0 - 5.0 % Final  . Basophils Relative 01/08/2019 0.8  0.0 - 3.0 % Final  . Neutro Abs 01/08/2019 2.8  1.4 - 7.7 K/uL Final  . Lymphs Abs 01/08/2019 1.7  0.7 - 4.0 K/uL Final  . Monocytes Absolute 01/08/2019 0.3  0.1 - 1.0 K/uL Final  . Eosinophils Absolute 01/08/2019 0.3  0.0 - 0.7 K/uL Final  .  Basophils Absolute 01/08/2019 0.0  0.0 - 0.1 K/uL Final  . TSH 01/08/2019 1.66  0.35 - 4.50 uIU/mL Final  . Free T4 01/08/2019 0.92  0.60 - 1.60 ng/dL Final   Comment: Specimens from patients who are undergoing biotin therapy and /or ingesting biotin supplements may contain high levels of biotin.  The higher biotin concentration in these specimens interferes with this Free T4 assay.  Specimens that contain high levels  of biotin may cause false high results for this Free T4 assay.  Please interpret results in light of the total clinical presentation of the patient.    . Color, Urine 01/08/2019 YELLOW  YELLOW Final  . APPearance 01/08/2019 CLOUDY* CLEAR Final  . Specific Gravity, Urine 01/08/2019 1.019  1.001 - 1.03 Final  . pH 01/08/2019 7.0  5.0 - 8.0 Final  . Glucose, UA 01/08/2019 NEGATIVE  NEGATIVE Final  . Bilirubin Urine 01/08/2019 NEGATIVE  NEGATIVE Final  . Ketones, ur 01/08/2019 TRACE* NEGATIVE Final  . Hgb urine dipstick 01/08/2019 NEGATIVE  NEGATIVE Final  . Protein, ur 01/08/2019 NEGATIVE  NEGATIVE Final  . Nitrite 01/08/2019 NEGATIVE  NEGATIVE Final  . Leukocytes, UA 01/08/2019 2+* NEGATIVE Final  . WBC, UA 01/08/2019 0-5  0 - 5 /HPF Final  . RBC / HPF 01/08/2019 0-2  0 - 2 /HPF Final  . Squamous Epithelial / LPF 01/08/2019 0-5  < OR = 5 /HPF Final  . Bacteria, UA 01/08/2019 NONE SEEN  NONE SEEN /HPF Final  . Calcium Oxalate Crystal 01/08/2019 FEW  NONE OR FE /HPF Final  . Hyaline Cast 01/08/2019 NONE SEEN  NONE SEEN /LPF Final  . VITD 01/08/2019 15.66* 30.00 - 100.00 ng/mL Final  . Cholesterol 01/08/2019 141  0 - 200 mg/dL Final   ATP III Classification       Desirable:  < 200 mg/dL               Borderline High:  200 - 239 mg/dL          High:  > = 240 mg/dL  . Triglycerides 01/08/2019 76.0  0.0 - 149.0 mg/dL Final   Normal:  <  150 mg/dLBorderline High:  150 - 199 mg/dL  . HDL 01/08/2019 47.30  >39.00 mg/dL Final  . VLDL 01/08/2019 15.2  0.0 - 40.0 mg/dL Final  . LDL  Cholesterol 01/08/2019 79  0 - 99 mg/dL Final  . Total CHOL/HDL Ratio 01/08/2019 3   Final                  Men          Women1/2 Average Risk     3.4          3.3Average Risk          5.0          4.42X Average Risk          9.6          7.13X Average Risk          15.0          11.0                      . NonHDL 01/08/2019 94.14   Final   NOTE:  Non-HDL goal should be 30 mg/dL higher than patient's LDL goal (i.e. LDL goal of < 70 mg/dL, would have non-HDL goal of < 100 mg/dL)  . Vitamin B-12 01/08/2019 660  211 - 911 pg/mL Final  . Iron 01/08/2019 46  40 - 190 mcg/dL Final  . TIBC 01/08/2019 343  250 - 450 mcg/dL (calc) Final  . %SAT 01/08/2019 13* 16 - 45 % (calc) Final  . Ferritin 01/08/2019 32  16 - 154 ng/mL Final  . Hgb A1c MFr Bld 01/08/2019 5.3  4.6 - 6.5 % Final   Glycemic Control Guidelines for People with Diabetes:Non Diabetic:  <6%Goal of Therapy: <7%Additional Action Suggested:  >8%   . Hepatitis B-Post 01/08/2019 <5* > OR = 10 mIU/mL Final   Comment: . Patient does not have immunity to hepatitis B virus. . For additional information, please refer to http://education.[facility].com/faq/FAQ105 (This link is being provided for informational/ educational purposes only).   . Rubeola IgG 01/08/2019 91.90  AU/mL Final   Comment: AU/mL            Interpretation -----            -------------- <13.50           Negative 13.50-16.49      Equivocal >16.49           Positive . A positive result indicates that the patient has antibody to measles virus. It does not differentiate  between an active or past infection. The clinical  diagnosis must be interpreted in conjunction with  clinical signs and symptoms of the patient.   . Mumps IgG 01/08/2019 13.70  AU/mL Final   Comment:  AU/mL           Interpretation -------         ---------------- <9.00             Negative 9.00-10.99        Equivocal >10.99            Positive A positive result indicates that the patient has   antibody to mumps virus. It does not differentiate between an  active or past infection. The clinical diagnosis must be interpreted in conjunction with clinical signs and symptoms of the patient. .   . Rubella 01/08/2019 3.90  index Final   Comment:     Index  Interpretation     -----            --------------       <0.90            Not consistent with Immunity     0.90-0.99        Equivocal     > or = 1.00      Consistent with Immunity  . The presence of rubella IgG antibody suggests  immunization or past or current infection with rubella virus.     Assessment:  Kelsey Mcguire is a 38 y.o. female with chronic iron deficiency anemia and menorrhagia.  She has been a vegetarian x 8 years and a vegan for a few months.  Anemia preceded a change in her diet.  She has taken oral iron for 6-7 years.  She has been on oral B12 off and on.  She notes heavy menses since she was a teenager.  She had excess bleeding postpartum.  She denied any excess bleeding with wisdom tooth extraction.  She does no take aspirin or ibuprofen.  CBC on 01/08/2018 revealed a hematocrit of 39.9, hemoglobin 13.4, MCV 90.4, platelets 281,000, WBC 5100 with an ANC of 2800.  Ferritin was 32 with an iron saturation of 13% and a TIBC of 343.  B12 was 660.  Creatinine was 0.60.  LFTs were normal. TSH was 1.66 with free T4 of 0.92.  Symptomatically, she notes fatigue when her "iron is low".  She denies any weight loss.  Exam is unremarkable.  Plan: 1.   Labs today:  CBC with diff, folate, PT, PTT, PFA, von Willebrand panel, von Willebrand multimers. 2.   Peripheral smear for path review. 3.   Bleeding diathesis  Patient relates a history of menorrhagia since she was a teenager, postpartum bleeding, and easy brusing.  She does not take aspirin or ibuprofen.  Discuss evaluation for von Willebrand's disease, factor deficiency, and qualitative platelet disorder. 4.   Low iron stores  Patient has a normal  hemoglobin with borderline low iron stores.    Discuss importance of iron and B12 supplement with current diet.  Anticipate improvement in iron stores if menses improved. 5.   RTC in 2 weeks for MD assessment, review of work-up and discussion regarding direction of therapy.   Honor Loh, NP  01/18/2019, 1:55 PM   I saw and evaluated the patient, participating in the key portions of the service and reviewing pertinent diagnostic studies and records.  I reviewed the nurse practitioner's note and agree with the findings and the plan.  The assessment and plan were discussed with the patient.  Multiple questions were asked by the patient and answered.   Nolon Stalls, MD 01/18/2019,1:55 PM

## 2019-01-18 ENCOUNTER — Inpatient Hospital Stay: Payer: 59 | Attending: Hematology and Oncology | Admitting: Hematology and Oncology

## 2019-01-18 ENCOUNTER — Encounter: Payer: Self-pay | Admitting: Hematology and Oncology

## 2019-01-18 ENCOUNTER — Inpatient Hospital Stay: Payer: 59

## 2019-01-18 VITALS — BP 115/71 | HR 75 | Temp 99.6°F | Resp 18 | Ht 70.0 in | Wt 197.9 lb

## 2019-01-18 DIAGNOSIS — N921 Excessive and frequent menstruation with irregular cycle: Secondary | ICD-10-CM | POA: Diagnosis not present

## 2019-01-18 DIAGNOSIS — D509 Iron deficiency anemia, unspecified: Secondary | ICD-10-CM

## 2019-01-18 DIAGNOSIS — Z87891 Personal history of nicotine dependence: Secondary | ICD-10-CM

## 2019-01-18 DIAGNOSIS — D699 Hemorrhagic condition, unspecified: Secondary | ICD-10-CM | POA: Diagnosis not present

## 2019-01-18 DIAGNOSIS — R790 Abnormal level of blood mineral: Secondary | ICD-10-CM | POA: Diagnosis not present

## 2019-01-18 DIAGNOSIS — N920 Excessive and frequent menstruation with regular cycle: Secondary | ICD-10-CM | POA: Diagnosis not present

## 2019-01-18 LAB — PLATELET FUNCTION ASSAY: Collagen / Epinephrine: 119 seconds (ref 0–193)

## 2019-01-18 LAB — CBC WITH DIFFERENTIAL/PLATELET
Abs Immature Granulocytes: 0.02 10*3/uL (ref 0.00–0.07)
Basophils Absolute: 0 10*3/uL (ref 0.0–0.1)
Basophils Relative: 1 %
Eosinophils Absolute: 0.2 10*3/uL (ref 0.0–0.5)
Eosinophils Relative: 3 %
HCT: 42.8 % (ref 36.0–46.0)
Hemoglobin: 14.1 g/dL (ref 12.0–15.0)
Immature Granulocytes: 0 %
Lymphocytes Relative: 25 %
Lymphs Abs: 1.6 10*3/uL (ref 0.7–4.0)
MCH: 30 pg (ref 26.0–34.0)
MCHC: 32.9 g/dL (ref 30.0–36.0)
MCV: 91.1 fL (ref 80.0–100.0)
Monocytes Absolute: 0.3 10*3/uL (ref 0.1–1.0)
Monocytes Relative: 4 %
Neutro Abs: 4.4 10*3/uL (ref 1.7–7.7)
Neutrophils Relative %: 67 %
Platelets: 285 10*3/uL (ref 150–400)
RBC: 4.7 MIL/uL (ref 3.87–5.11)
RDW: 11.7 % (ref 11.5–15.5)
WBC: 6.5 10*3/uL (ref 4.0–10.5)
nRBC: 0 % (ref 0.0–0.2)

## 2019-01-18 LAB — PROTIME-INR
INR: 0.95
PROTHROMBIN TIME: 12.6 s (ref 11.4–15.2)

## 2019-01-18 LAB — FOLATE: Folate: 11.2 ng/mL (ref >5.9)

## 2019-01-18 LAB — APTT: aPTT: 30 seconds (ref 24–36)

## 2019-01-19 LAB — VON WILLEBRAND PANEL
Coagulation Factor VIII: 157 % — ABNORMAL HIGH (ref 56–140)
Ristocetin Co-factor, Plasma: 98 % (ref 50–200)
Von Willebrand Antigen, Plasma: 114 % (ref 50–200)

## 2019-01-19 LAB — COAG STUDIES INTERP REPORT

## 2019-01-19 LAB — PATHOLOGIST SMEAR REVIEW

## 2019-01-21 ENCOUNTER — Encounter: Payer: 59 | Admitting: Internal Medicine

## 2019-01-25 LAB — VON WILLEBRAND FACTOR MULTIMER

## 2019-01-30 NOTE — Progress Notes (Signed)
[facility]     [address]     [place], [place] [address]     Phone: [phone]      Fax: [phone]        Clinic day:  02/01/2019  Chief Complaint: Kelsey Mcguire is a 38 y.o. female with menorrhagia who is seen for review of work-up and discussion regarding direction of therapy.  HPI:  The patient was last seen in the hematology clinic on 01/18/2019.  She noted chronic iron deficiency anemia and menorrhagia.  She had been a vegetarian x 8 years and a vegan for a few months.  Anemia preceded a change in her diet.  She had been on oral iron for 8 years and B12 on and off.  She described bruising easily, gingival bleeding, heavy menses, and post-partum bleeding.  Work-up on 01/18/2019 revealed hematocrit of 42.8, hemoglobin 14.1, MCV 91.1, platelets 285,000, white count 6500 with an ANC of 4400.  PT was 12.6 (INR 0.95) and PTT 30.  Von Willebrand factor VIII was 157%, ristocetin cofactor 98%, and von Willebrand antigen 114%.  Von Willebrand multimers revealed a normal pattern and distribution.  Platelet function assay was normal.  Folate was 11.2.  Peripheral smear was normal.  During the interim,  She notes no new symptoms.  She notes that she was spotting 2 weeks ago.  Menses lasted a typical 9 days.  She has a couple of bruises.   Past Medical History:  Diagnosis Date  . Anemia   . IUD complication ([place])    spontaneously came out while on honeymoon  . Migraine   . Sinus infection   . Tourette syndrome   . Urinary tract infection     Past Surgical History:  Procedure Laterality Date  . WISDOM TOOTH EXTRACTION      Family History  Problem Relation Age of Onset  . Depression Mother   . Anxiety disorder Mother   . Heart disease Father   . Hypertension Father   . Diabetes Father   . Tourette syndrome Father     Social History:  reports that she has quit smoking. She has never used smokeless tobacco. She reports that she does not  drink alcohol. No history on file for drug.  She lives in [place].  The patient is alone today.  Allergies:  Allergies  Allergen Reactions  . Augmentin [Amoxicillin-Pot Clavulanate] Nausea Only    Current Medications: Current Outpatient Medications  Medication Sig Dispense Refill  . Ascorbic Acid (VITAMIN C) 1000 MG tablet Take 1,000 mg by mouth daily.    . Cholecalciferol 1.25 MG (50000 UT) capsule Take 1 capsule (50,000 Units total) by mouth once a week. 13 capsule 1  . ferrous sulfate 325 (65 FE) MG tablet Take by mouth daily.    . vitamin B-12 (CYANOCOBALAMIN) 1000 MCG tablet Take 1,000 mcg by mouth daily.    Marland Kitchen zolpidem (AMBIEN) 10 MG tablet Take 1 tablet (10 mg total) by mouth at bedtime. prn (Patient taking differently: Take 5 mg by mouth at bedtime. prn) 90 tablet 0   No current facility-administered medications for this visit.     Review of Systems:  GENERAL:  Feels "ok".  No fevers, sweats or weight loss. PERFORMANCE STATUS (ECOG):  1 HEENT:  No visual changes, runny nose, sore throat, mouth sores or tenderness. Lungs: No shortness of breath or cough.  No hemoptysis. Cardiac:  No chest pain, palpitations, orthopnea, or PND. GI:  No  nausea, vomiting, diarrhea, constipation, melena or hematochezia. GU:  Recent typical 9 day menses.  No urgency, frequency, dysuria, or hematuria. Musculoskeletal:  No back pain.  No joint pain.  No muscle tenderness. Extremities:  No pain or swelling. Skin:  Couple of bruises.  No rashes or skin changes. Neuro:  No headache, numbness or weakness, balance or coordination issues. Endocrine:  No diabetes, thyroid issues, hot flashes or night sweats. Psych:  No mood changes, depression or anxiety. Pain:  No focal pain. Review of systems:  All other systems reviewed and found to be negative.   Physical Exam: Blood pressure 123/82, pulse 74, temperature 98.2 F (36.8 C), temperature source Oral, resp. rate 16, weight 197 lb 12 oz (89.7 kg),  SpO2 100 %, unknown if currently breastfeeding. GENERAL:  Well developed, well nourished, woman sitting comfortably in the exam room in no acute distress. MENTAL STATUS:  Alert and oriented to person, place and time. HEAD:  Brown hair pulled up.  Normocephalic, atraumatic, face symmetric, no Cushingoid features. EYES:  Brown eyes.  No conjunctivitis or scleral icterus. NEUROLOGICAL: Unremarkable. PSYCH:  Appropriate.    No visits with results within 3 Day(s) from this visit.  Latest known visit with results is:  Clinical Support on 01/18/2019  Component Date Value Ref Range Status  . Von Willebrand Multimers 01/18/2019 Comment   Final   Comment: (NOTE) VWF multimer analysis shows a normal pattern and distribution of bands. This is the pattern of multimers that occurs in both normal individuals and those with type 1 VW Disease as well as in both type 3M and 2N VW disease. Performed At: [facility] [address] [place], [place] [id number] Jake Bathe MD [id number]   . Coagulation Factor VIII 01/18/2019 157* 56 - 140 % Final  . Ristocetin Co-factor, Plasma 01/18/2019 98  50 - 200 % Final   Comment: (NOTE) Performed At: [facility] [place], [place] [id number] Rush Farmer MD [id number]   . Von Willebrand Antigen, Plasma 01/18/2019 114  50 - 200 % Final   Comment: (NOTE) This test was developed and its performance characteristics determined by [facility]. It has not been cleared or approved by the Food and Drug Administration.   Marland Kitchen PFA Interpretation 01/18/2019          Final   Comment: Platelet function is normal. If patient history/physical examination give strong indication of a bleeding disorder repeat testing for confirmation.        Results of the test should always be interpreted in conjunction with the patient's medical history, clinical presentation and medication history. Patients with Hematocrit values <35.0% or  Platelet counts <150,000/uL may result in values above the Laboratory established reference range.   . Collagen / Epinephrine 01/18/2019 119  0 - 193 seconds Final   Performed at [facility], [address] [place], [place] [address]  . aPTT 01/18/2019 30  24 - 36 seconds Final   Performed at [facility], [address] [place], [place] [address]  . Prothrombin Time 01/18/2019 12.6  11.4 - 15.2 seconds Final  . INR 01/18/2019 0.95   Final   Performed at [facility], [address] [place], [place] [address]  . Folate 01/18/2019 11.2  >5.9 ng/mL Final   Performed at [facility], [address] [place], [place] [address]  . WBC 01/18/2019 6.5  4.0 - 10.5 K/uL Final  . RBC 01/18/2019 4.70  3.87 - 5.11 MIL/uL Final  .  Hemoglobin 01/18/2019 14.1  12.0 - 15.0 g/dL Final  . HCT 01/18/2019 42.8  36.0 - 46.0 % Final  . MCV 01/18/2019 91.1  80.0 - 100.0 fL Final  . MCH 01/18/2019 30.0  26.0 - 34.0 pg Final  . MCHC 01/18/2019 32.9  30.0 - 36.0 g/dL Final  . RDW 01/18/2019 11.7  11.5 - 15.5 % Final  . Platelets 01/18/2019 285  150 - 400 K/uL Final  . nRBC 01/18/2019 0.0  0.0 - 0.2 % Final  . Neutrophils Relative % 01/18/2019 67  % Final  . Neutro Abs 01/18/2019 4.4  1.7 - 7.7 K/uL Final  . Lymphocytes Relative 01/18/2019 25  % Final  . Lymphs Abs 01/18/2019 1.6  0.7 - 4.0 K/uL Final  . Monocytes Relative 01/18/2019 4  % Final  . Monocytes Absolute 01/18/2019 0.3  0.1 - 1.0 K/uL Final  . Eosinophils Relative 01/18/2019 3  % Final  . Eosinophils Absolute 01/18/2019 0.2  0.0 - 0.5 K/uL Final  . Basophils Relative 01/18/2019 1  % Final  . Basophils Absolute 01/18/2019 0.0  0.0 - 0.1 K/uL Final  . Immature Granulocytes 01/18/2019 0  % Final  . Abs Immature Granulocytes 01/18/2019 0.02  0.00 - 0.07 K/uL Final   Performed at [facility], [address], [place], [place] [address]  . Path Review 01/18/2019 Peripheral blood smear is reviewed.   Final     Comment: Within normal limits. Reviewed by Louanne Belton Rodney Cruise, M.D. Performed at [facility], [address], [place], [place] [address]   . Interpretation 01/18/2019 Note   Final   Comment: (NOTE) ------------------------------- COAGULATION: VON WILLEBRAND FACTOR ASSESSMENT CURRENT RESULTS ASSESSMENT The VWF:Ag is normal. The VWF:RCo is normal. The FVIII is elevated. VON WILLEBRAND FACTOR ASSESSMENT CURRENT RESULTS INTERPRETATION - These results are not consistent with a diagnosis of VWD according to the current NHLBI guideline. Persistently elevated FVIII activity is a risk factor for venous thrombosis as well as recurrence of venous thrombosis. Risk is graded and increases with the degree of elevation. Although elevated FVIII activity has been identified to cluster within families, a genetic basis for the elevation has not yet been elucidated (Br J Haematol. 2012; 157(6):653-663). VON WILLEBRAND FACTOR ASSESSMENT - Results may be falsely elevated and possibly falsely normal as VWF and FVIII may increase in pregnancy, in samples drawn from patients (particularly children) who are visibly stressed at the time of phlebotomy, as acute phase reactants                          , or in response to certain drug therapies such as desmopressin. Repeat testing may be necessary before excluding a diagnosis of VWD especially if the clinical suspicion is high for an underlying bleeding disorder. The setting for phlebotomy should be as calm as possible and patients should be encouraged to sit quietly prior to the blood draw. VON WILLEBRAND FACTOR ASSESSMENT DEFINITIONS - VWD - von Willebrand disease; VWF - von Willebrand factor; VWF:Ag - VWF antigen; VWF:RCo - VWF ristocetin cofactor activity; FVIII - factor VIII activity. - MEDICAL DIRECTOR: For questions regarding panel interpretation, please contact Jake Bathe, M.D. at [facility]  at [phone]. ------------------------------- DISCLAIMER These assessments and interpretations are provided as a convenience in support of the physician-patient relationship and are not intended to replace the physician's clinical judgment. They are derived from national guidelines in addit  ion to other evidence and expert opinion. The clinician should consider this information within the context of clinical opinion and the individual patient. SEE GUIDANCE FOR VON WILLEBRAND FACTOR ASSESSMENT: (1) The National Heart, Lung and Blood Institute. The Diagnosis, Evaluation and Management of von Willebrand Disease. Janeal Holmes, MD: [place] Publication 29-7989. 2119. Available at vSpecials.com.pt. (2) Daryl Eastern et al. Carmin Muskrat J Hematol. 2009; 84(6):366-370. (3) Lewis. 2004;10(3):199-217. (4) Pasi KJ et al. Haemophilia. 2004; 10(3):218-231. Performed At: [facility] [place], [place] [id number] Thomasene Ripple MD [id number]     Assessment:  Kelsey Mcguire is a 38 y.o. female with chronic iron deficiency anemia and menorrhagia.  She has been a vegetarian x 8 years and a vegan for a few months.  Anemia preceded a change in her diet.  She has taken oral iron for 6-7 years.  She has been on oral B12 off and on.  She notes heavy menses since she was a teenager.  She had excess bleeding postpartum.  She denied any excess bleeding with wisdom tooth extraction.  She does no take aspirin or ibuprofen.  Normal labs on 01/08/2019 included:  B12 (660), creatinine (0.60), LFTs, TSH, and free T4 were normal.  Ferritin was 32 with an iron saturation of 13% and a TIBC of 343.  Folate was 11.2 on 01/18/2019.  Ferritin has been followed: 33 on 11/04/2012, 21.1 on 02/22/2013, 51.6 on 12/20/2013, 50 on 01/27/2015, and 32 on 01/08/2019.  Work-up on 01/18/2019 revealed hematocrit of  42.8, hemoglobin 14.1, MCV 91.1, platelets 285,000, white count 6500 with an ANC of 4400.  Normal labs included:  PT, PTT, von Willebrand panel and multimers, and platelet function assay.   Von Willebrand factor VIII was 157%, ristocetin cofactor 98%, and von Willebrand antigen 114%.  Von Willebrand panel on 12/20/2013 revealed a von Willebrand factor VIII of 24%, ristocetin cofactor 69%, and von Willebrand antigen 85%.   Symptomatically, she denies any new complaints.  Exam is unremarkable.  Plan: 1.   Review labs from 01/18/2019. 2.   Bleeding diathesis  Discuss normal platelet count and function, normal PT and PTT and normal von willebrand panel with multimers.  Discuss von Willebrand testing from 12/20/2013 with a low factor VIII level.  Discuss fluctuating level of von Willebrand's protein.  Discuss repeating von Willebrand panel when patient experiencing a heavy menses.  Patient to contact clinic with heavy menses.  Order in place. 3.   Low iron stores  Hematocrit 42.8.  Hemoglobin 14.1.  MCV 91.1.  Ferritin 32 on 01/08/2019.  Goal ferritin 100.  Etiology likely secondary to menorrhagia.  Continue iron rich foods and oral iron.  No current plan for IV iron. 4.   RTC prn based on above testing.  I discussed the assessment and treatment plan with the patient.  The patient was provided an opportunity to ask questions and all were answered.  The patient agreed with the plan and demonstrated an understanding of the instructions.  The patient was advised to call back or seek an in person evaluation if the symptoms worsen or if the condition fails to improve as anticipated.    Lequita Asal, MD  02/01/2019, 3:36 PM

## 2019-02-01 ENCOUNTER — Inpatient Hospital Stay (HOSPITAL_BASED_OUTPATIENT_CLINIC_OR_DEPARTMENT_OTHER): Payer: 59 | Admitting: Hematology and Oncology

## 2019-02-01 VITALS — BP 123/82 | HR 74 | Temp 98.2°F | Resp 16 | Wt 197.8 lb

## 2019-02-01 DIAGNOSIS — R790 Abnormal level of blood mineral: Secondary | ICD-10-CM

## 2019-02-01 DIAGNOSIS — N920 Excessive and frequent menstruation with regular cycle: Secondary | ICD-10-CM | POA: Diagnosis not present

## 2019-02-01 DIAGNOSIS — N921 Excessive and frequent menstruation with irregular cycle: Secondary | ICD-10-CM

## 2019-02-01 DIAGNOSIS — D509 Iron deficiency anemia, unspecified: Secondary | ICD-10-CM | POA: Diagnosis not present

## 2019-02-01 DIAGNOSIS — D699 Hemorrhagic condition, unspecified: Secondary | ICD-10-CM | POA: Diagnosis not present

## 2019-02-01 DIAGNOSIS — Z87891 Personal history of nicotine dependence: Secondary | ICD-10-CM

## 2019-02-01 NOTE — Progress Notes (Signed)
Pt here for follow up. Denies any concerns at this time.  

## 2019-02-02 ENCOUNTER — Encounter: Payer: Self-pay | Admitting: Internal Medicine

## 2019-02-02 ENCOUNTER — Ambulatory Visit (INDEPENDENT_AMBULATORY_CARE_PROVIDER_SITE_OTHER): Payer: 59 | Admitting: Internal Medicine

## 2019-02-02 ENCOUNTER — Other Ambulatory Visit (HOSPITAL_COMMUNITY)
Admission: RE | Admit: 2019-02-02 | Discharge: 2019-02-02 | Disposition: A | Discharge status: Home / Self Care | Payer: 59 | Source: Ambulatory Visit | Attending: Internal Medicine | Admitting: Internal Medicine

## 2019-02-02 VITALS — BP 116/68 | HR 78 | Temp 98.0°F | Ht 70.0 in | Wt 198.8 lb

## 2019-02-02 DIAGNOSIS — Z0000 Encounter for general adult medical examination without abnormal findings: Secondary | ICD-10-CM

## 2019-02-02 DIAGNOSIS — N898 Other specified noninflammatory disorders of vagina: Secondary | ICD-10-CM | POA: Diagnosis present

## 2019-02-02 DIAGNOSIS — E559 Vitamin D deficiency, unspecified: Secondary | ICD-10-CM

## 2019-02-02 DIAGNOSIS — G4700 Insomnia, unspecified: Secondary | ICD-10-CM | POA: Diagnosis not present

## 2019-02-02 DIAGNOSIS — F5104 Psychophysiologic insomnia: Secondary | ICD-10-CM

## 2019-02-02 DIAGNOSIS — Z124 Encounter for screening for malignant neoplasm of cervix: Secondary | ICD-10-CM

## 2019-02-02 MED ORDER — ZOLPIDEM TARTRATE 5 MG PO TABS: 5.0000 mg | ORAL_TABLET | Freq: Every day | ORAL | 5 refills | Status: DC

## 2019-02-02 NOTE — Patient Instructions (Addendum)
Geographic tongue web MD or [place] [facility]   Insomnia Insomnia is a sleep disorder that makes it difficult to fall asleep or stay asleep. Insomnia can cause fatigue, low energy, difficulty concentrating, mood swings, and poor performance at work or school. There are three different ways to classify insomnia:  Difficulty falling asleep.  Difficulty staying asleep.  Waking up too early in the morning. Any type of insomnia can be long-term (chronic) or short-term (acute). Both are common. Short-term insomnia usually lasts for three months or less. Chronic insomnia occurs at least three times a week for longer than three months. What are the causes? Insomnia may be caused by another condition, situation, or substance, such as:  Anxiety.  Certain medicines.  Gastroesophageal reflux disease (GERD) or other gastrointestinal conditions.  Asthma or other breathing conditions.  Restless legs syndrome, sleep apnea, or other sleep disorders.  Chronic pain.  Menopause.  Stroke.  Abuse of alcohol, tobacco, or illegal drugs.  Mental health conditions, such as depression.  Caffeine.  Neurological disorders, such as Alzheimer's disease.  An overactive thyroid (hyperthyroidism). Sometimes, the cause of insomnia may not be known. What increases the risk? Risk factors for insomnia include:  Gender. Women are affected more often than men.  Age. Insomnia is more common as you get older.  Stress.  Lack of exercise.  Irregular work schedule or working night shifts.  Traveling between different time zones.  Certain medical and mental health conditions. What are the signs or symptoms? If you have insomnia, the main symptom is having trouble falling asleep or having trouble staying asleep. This may lead to other symptoms, such as:  Feeling fatigued or having low energy.  Feeling nervous about going to sleep.  Not feeling rested in the morning.  Having trouble  concentrating.  Feeling irritable, anxious, or depressed. How is this diagnosed? This condition may be diagnosed based on:  Your symptoms and medical history. Your health care provider may ask about: ? Your sleep habits. ? Any medical conditions you have. ? Your mental health.  A physical exam. How is this treated? Treatment for insomnia depends on the cause. Treatment may focus on treating an underlying condition that is causing insomnia. Treatment may also include:  Medicines to help you sleep.  Counseling or therapy.  Lifestyle adjustments to help you sleep better. Follow these instructions at home: Eating and drinking   Limit or avoid alcohol, caffeinated beverages, and cigarettes, especially close to bedtime. These can disrupt your sleep.  Do not eat a large meal or eat spicy foods right before bedtime. This can lead to digestive discomfort that can make it hard for you to sleep. Sleep habits   Keep a sleep diary to help you and your health care provider figure out what could be causing your insomnia. Write down: ? When you sleep. ? When you wake up during the night. ? How well you sleep. ? How rested you feel the next day. ? Any side effects of medicines you are taking. ? What you eat and drink.  Make your bedroom a dark, comfortable place where it is easy to fall asleep. ? Put up shades or blackout curtains to block light from outside. ? Use a white noise machine to block noise. ? Keep the temperature cool.  Limit screen use before bedtime. This includes: ? Watching TV. ? Using your smartphone, tablet, or computer.  Stick to a routine that includes going to bed and waking up at the same times every day and  night. This can help you fall asleep faster. Consider making a quiet activity, such as reading, part of your nighttime routine.  Try to avoid taking naps during the day so that you sleep better at night.  Get out of bed if you are still awake after 15  minutes of trying to sleep. Keep the lights down, but try reading or doing a quiet activity. When you feel sleepy, go back to bed. General instructions  Take over-the-counter and prescription medicines only as told by your health care provider.  Exercise regularly, as told by your health care provider. Avoid exercise starting several hours before bedtime.  Use relaxation techniques to manage stress. Ask your health care provider to suggest some techniques that may work well for you. These may include: ? Breathing exercises. ? Routines to release muscle tension. ? Visualizing peaceful scenes.  Make sure that you drive carefully. Avoid driving if you feel very sleepy.  Keep all follow-up visits as told by your health care provider. This is important. Contact a health care provider if:  You are tired throughout the day.  You have trouble in your daily routine due to sleepiness.  You continue to have sleep problems, or your sleep problems get worse. Get help right away if:  You have serious thoughts about hurting yourself or someone else. If you ever feel like you may hurt yourself or others, or have thoughts about taking your own life, get help right away. You can go to your nearest emergency department or call:  Your local emergency services (911 in the [place]  A suicide crisis helpline, such as the [place] at 201-216-3665. This is open 24 hours a day. Summary  Insomnia is a sleep disorder that makes it difficult to fall asleep or stay asleep.  Insomnia can be long-term (chronic) or short-term (acute).  Treatment for insomnia depends on the cause. Treatment may focus on treating an underlying condition that is causing insomnia.  Keep a sleep diary to help you and your health care provider figure out what could be causing your insomnia. This information is not intended to replace advice given to you by your health care provider. Make sure you discuss  any questions you have with your health care provider. Document Released: 11/22/2000 Document Revised: 09/04/2017 Document Reviewed: 09/04/2017 Elsevier Interactive Patient Education  2019 [employer].

## 2019-02-02 NOTE — Progress Notes (Signed)
Chief Complaint  Patient presents with  . Annual Exam   Annual  1. Reviewed labs  2. Chronic insomnia she is willing to go down from 10 mg to 5 mg Ambien and does not want to get the medication from psychiatry right now  3. Pap today    Review of Systems  Constitutional: Negative for weight loss.  HENT: Negative for hearing loss.   Eyes: Negative for blurred vision.  Respiratory: Negative for shortness of breath.   Cardiovascular: Negative for chest pain.  Gastrointestinal: Negative for abdominal pain.  Musculoskeletal: Negative for falls.  Skin: Negative for rash.  Neurological: Negative for headaches.  Psychiatric/Behavioral: The patient has insomnia.    Past Medical History:  Diagnosis Date  . Anemia   . IUD complication ([facility])    spontaneously came out while on honeymoon  . Migraine   . Sinus infection   . Tourette syndrome   . Urinary tract infection    Past Surgical History:  Procedure Laterality Date  . WISDOM TOOTH EXTRACTION     Family History  Problem Relation Age of Onset  . Depression Mother   . Anxiety disorder Mother   . Heart disease Father   . Hypertension Father   . Diabetes Father   . Tourette syndrome Father    Social History   Socioeconomic History  . Marital status: Single    Spouse name: Not on file  . Number of children: Not on file  . Years of education: Not on file  . Highest education level: Not on file  Occupational History  . Not on file  Social Needs  . Financial resource strain: Not on file  . Food insecurity:    Worry: Not on file    Inability: Not on file  . Transportation needs:    Medical: Not on file    Non-medical: Not on file  Tobacco Use  . Smoking status: Former Research scientist (life sciences)  . Smokeless tobacco: Never Used  . Tobacco comment: E-cigarette  Substance and Sexual Activity  . Alcohol use: No  . Drug use: Not on file  . Sexual activity: Not on file  Lifestyle  . Physical activity:    Days per week: Not on file   Minutes per session: Not on file  . Stress: Not on file  Relationships  . Social connections:    Talks on phone: Not on file    Gets together: Not on file    Attends religious service: Not on file    Active member of club or organization: Not on file    Attends meetings of clubs or organizations: Not on file    Relationship status: Not on file  . Intimate partner violence:    Fear of current or ex partner: Not on file    Emotionally abused: Not on file    Physically abused: Not on file    Forced sexual activity: Not on file  Other Topics Concern  . Not on file  Social History Narrative   Lives in [place].      Works - [employer]   Current Meds  Medication Sig  . Ascorbic Acid (VITAMIN C) 1000 MG tablet Take 1,000 mg by mouth daily.  . Cholecalciferol 1.25 MG (50000 UT) capsule Take 1 capsule (50,000 Units total) by mouth once a week.  . ferrous sulfate 325 (65 FE) MG tablet Take by mouth daily.  . vitamin B-12 (CYANOCOBALAMIN) 1000 MCG tablet Take 1,000  mcg by mouth daily.  Marland Kitchen zolpidem (AMBIEN) 10 MG tablet Take 1 tablet (10 mg total) by mouth at bedtime. prn (Patient taking differently: Take 5 mg by mouth at bedtime. prn)   Allergies  Allergen Reactions  . Augmentin [Amoxicillin-Pot Clavulanate] Nausea Only   Recent Results (from the past 2160 hour(s))  Comprehensive metabolic panel     Status: Abnormal   Collection Time: 01/08/19  9:21 AM  Result Value Ref Range   Sodium 136 135 - 145 mEq/L   Potassium 4.2 3.5 - 5.1 mEq/L   Chloride 103 96 - 112 mEq/L   CO2 25 19 - 32 mEq/L   Glucose, Bld 102 (H) 70 - 99 mg/dL   BUN 7 6 - 23 mg/dL   Creatinine, Ser 0.60 0.40 - 1.20 mg/dL   Total Bilirubin 0.5 0.2 - 1.2 mg/dL   Alkaline Phosphatase 65 39 - 117 U/L   AST 16 0 - 37 U/L   ALT 12 0 - 35 U/L   Total Protein 6.8 6.0 - 8.3 g/dL   Albumin 4.0 3.5 - 5.2 g/dL   Calcium 9.4 8.4 - 10.5 mg/dL   GFR 112.00 >60.00 mL/min  CBC with  Differential/Platelet     Status: None   Collection Time: 01/08/19  9:21 AM  Result Value Ref Range   WBC 5.1 4.0 - 10.5 K/uL   RBC 4.42 3.87 - 5.11 Mil/uL   Hemoglobin 13.4 12.0 - 15.0 g/dL   HCT 39.9 36.0 - 46.0 %   MCV 90.4 78.0 - 100.0 fl   MCHC 33.5 30.0 - 36.0 g/dL   RDW 12.7 11.5 - 15.5 %   Platelets 281.0 150.0 - 400.0 K/uL   Neutrophils Relative % 54.7 43.0 - 77.0 %   Lymphocytes Relative 32.8 12.0 - 46.0 %   Monocytes Relative 6.7 3.0 - 12.0 %   Eosinophils Relative 5.0 0.0 - 5.0 %   Basophils Relative 0.8 0.0 - 3.0 %   Neutro Abs 2.8 1.4 - 7.7 K/uL   Lymphs Abs 1.7 0.7 - 4.0 K/uL   Monocytes Absolute 0.3 0.1 - 1.0 K/uL   Eosinophils Absolute 0.3 0.0 - 0.7 K/uL   Basophils Absolute 0.0 0.0 - 0.1 K/uL  TSH     Status: None   Collection Time: 01/08/19  9:21 AM  Result Value Ref Range   TSH 1.66 0.35 - 4.50 uIU/mL  T4, free     Status: None   Collection Time: 01/08/19  9:21 AM  Result Value Ref Range   Free T4 0.92 0.60 - 1.60 ng/dL    Comment: Specimens from patients who are undergoing biotin therapy and /or ingesting biotin supplements may contain high levels of biotin.  The higher biotin concentration in these specimens interferes with this Free T4 assay.  Specimens that contain high levels  of biotin may cause false high results for this Free T4 assay.  Please interpret results in light of the total clinical presentation of the patient.    Urinalysis, Routine w reflex microscopic     Status: Abnormal   Collection Time: 01/08/19  9:21 AM  Result Value Ref Range   Color, Urine YELLOW YELLOW   APPearance CLOUDY (A) CLEAR   Specific Gravity, Urine 1.019 1.001 - 1.03   pH 7.0 5.0 - 8.0   Glucose, UA NEGATIVE NEGATIVE   Bilirubin Urine NEGATIVE NEGATIVE   Ketones, ur TRACE (A) NEGATIVE   Hgb urine dipstick NEGATIVE NEGATIVE   Protein, ur NEGATIVE NEGATIVE  Nitrite NEGATIVE NEGATIVE   Leukocytes, UA 2+ (A) NEGATIVE   WBC, UA 0-5 0 - 5 /HPF   RBC / HPF 0-2 0 - 2  /HPF   Squamous Epithelial / LPF 0-5 < OR = 5 /HPF   Bacteria, UA NONE SEEN NONE SEEN /HPF   Calcium Oxalate Crystal FEW NONE OR FE /HPF   Hyaline Cast NONE SEEN NONE SEEN /LPF  Vitamin D (25 hydroxy)     Status: Abnormal   Collection Time: 01/08/19  9:21 AM  Result Value Ref Range   VITD 15.66 (L) 30.00 - 100.00 ng/mL  Lipid panel     Status: None   Collection Time: 01/08/19  9:21 AM  Result Value Ref Range   Cholesterol 141 0 - 200 mg/dL    Comment: ATP III Classification       Desirable:  < 200 mg/dL               Borderline High:  200 - 239 mg/dL          High:  > = 240 mg/dL   Triglycerides 76.0 0.0 - 149.0 mg/dL    Comment: Normal:  <150 mg/dLBorderline High:  150 - 199 mg/dL   HDL 47.30 >39.00 mg/dL   VLDL 15.2 0.0 - 40.0 mg/dL   LDL Cholesterol 79 0 - 99 mg/dL   Total CHOL/HDL Ratio 3     Comment:                Men          Women1/2 Average Risk     3.4          3.3Average Risk          5.0          4.42X Average Risk          9.6          7.13X Average Risk          15.0          11.0                       NonHDL 94.14     Comment: NOTE:  Non-HDL goal should be 30 mg/dL higher than patient's LDL goal (i.e. LDL goal of < 70 mg/dL, would have non-HDL goal of < 100 mg/dL)  B12     Status: None   Collection Time: 01/08/19  9:21 AM  Result Value Ref Range   Vitamin B-12 660 211 - 911 pg/mL  Iron, TIBC and Ferritin Panel     Status: Abnormal   Collection Time: 01/08/19  9:21 AM  Result Value Ref Range   Iron 46 40 - 190 mcg/dL   TIBC 343 250 - 450 mcg/dL (calc)   %SAT 13 (L) 16 - 45 % (calc)   Ferritin 32 16 - 154 ng/mL  Hemoglobin A1c     Status: None   Collection Time: 01/08/19  9:21 AM  Result Value Ref Range   Hgb A1c MFr Bld 5.3 4.6 - 6.5 %    Comment: Glycemic Control Guidelines for People with Diabetes:Non Diabetic:  <6%Goal of Therapy: <7%Additional Action Suggested:  >8%   Hepatitis B surface antibody,quantitative     Status: Abnormal   Collection Time: 01/08/19   9:21 AM  Result Value Ref Range   Hepatitis B-Post <5 (L) > OR = 10 mIU/mL    Comment: . Patient does not have immunity to  hepatitis B virus. . For additional information, please refer to http://education.questdiagnostics.com/faq/FAQ105 (This link is being provided for informational/ educational purposes only).   Measles/Mumps/Rubella Immunity     Status: None   Collection Time: 01/08/19  9:21 AM  Result Value Ref Range   Rubeola IgG 91.90 AU/mL    Comment: AU/mL            Interpretation -----            -------------- <13.50           Negative 13.50-16.49      Equivocal >16.49           Positive . A positive result indicates that the patient has antibody to measles virus. It does not differentiate  between an active or past infection. The clinical  diagnosis must be interpreted in conjunction with  clinical signs and symptoms of the patient.    Mumps IgG 13.70 AU/mL    Comment:  AU/mL           Interpretation -------         ---------------- <9.00             Negative 9.00-10.99        Equivocal >10.99            Positive A positive result indicates that the patient has  antibody to mumps virus. It does not differentiate between an  active or past infection. The clinical diagnosis must be interpreted in conjunction with clinical signs and symptoms of the patient. .    Rubella 3.90 index    Comment:     Index            Interpretation     -----            --------------       <0.90            Not consistent with Immunity     0.90-0.99        Equivocal     > or = 1.00      Consistent with Immunity  . The presence of rubella IgG antibody suggests  immunization or past or current infection with rubella virus.   Von Willebrand multimeric     Status: None   Collection Time: 01/18/19  2:17 PM  Result Value Ref Range   Von Willebrand Multimers Comment     Comment: (NOTE) VWF multimer analysis shows a normal pattern and distribution of bands. This is the pattern of  multimers that occurs in both normal individuals and those with type 1 VW Disease as well as in both type 55M and 2N VW disease. Performed At: [facility] [place], [place] [id number] Jake Bathe MD [id number]   Von Willebrand panel     Status: Abnormal   Collection Time: 01/18/19  2:17 PM  Result Value Ref Range   Coagulation Factor VIII 157 (H) 56 - 140 %   Ristocetin Co-factor, Plasma 98 50 - 200 %    Comment: (NOTE) Performed At: [facility] [place], [place] [id number] Rush Farmer MD [id number]    Von Willebrand Antigen, Plasma 114 50 - 200 %    Comment: (NOTE) This test was developed and its performance characteristics determined by [facility]. It has not been cleared or approved by the Food and Drug Administration.   Platelet function assay     Status: None   Collection Time: 01/18/19  2:17 PM  Result Value Ref Range   PFA Interpretation            Comment: Platelet function is normal. If patient history/physical examination give strong indication of a bleeding disorder repeat testing for confirmation.        Results of the test should always be interpreted in conjunction with the patient's medical history, clinical presentation and medication history. Patients with Hematocrit values <35.0% or Platelet counts <150,000/uL may result in values above the Laboratory established reference range.    Collagen / Epinephrine 119 0 - 193 seconds    Comment: Performed at [facility], [address] [place], [place] [address]  APTT     Status: None   Collection Time: 01/18/19  2:17 PM  Result Value Ref Range   aPTT 30 24 - 36 seconds    Comment: Performed at [facility], [address] [place], [place] [address]  Protime-INR     Status: None   Collection Time: 01/18/19  2:17 PM  Result Value Ref Range   Prothrombin Time 12.6 11.4 - 15.2 seconds   INR 0.95     Comment: Performed at [facility], [address] [place], [place] [address]  Folate     Status: None   Collection Time: 01/18/19  2:17 PM  Result Value Ref Range   Folate 11.2 >5.9 ng/mL    Comment: Performed at [facility], [address] [place], [place] [address]  CBC with Differential/Platelet     Status: None   Collection Time: 01/18/19  2:17 PM  Result Value Ref Range   WBC 6.5 4.0 - 10.5 K/uL   RBC 4.70 3.87 - 5.11 MIL/uL   Hemoglobin 14.1 12.0 - 15.0 g/dL   HCT 42.8 36.0 - 46.0 %   MCV 91.1 80.0 - 100.0 fL   MCH 30.0 26.0 - 34.0 pg   MCHC 32.9 30.0 - 36.0 g/dL   RDW 11.7 11.5 - 15.5 %   Platelets 285 150 - 400 K/uL   nRBC 0.0 0.0 - 0.2 %   Neutrophils Relative % 67 %   Neutro Abs 4.4 1.7 - 7.7 K/uL   Lymphocytes Relative 25 %   Lymphs Abs 1.6 0.7 - 4.0 K/uL   Monocytes Relative 4 %   Monocytes Absolute 0.3 0.1 - 1.0 K/uL   Eosinophils Relative 3 %   Eosinophils Absolute 0.2 0.0 - 0.5 K/uL   Basophils Relative 1 %   Basophils Absolute 0.0 0.0 - 0.1 K/uL   Immature Granulocytes 0 %   Abs Immature Granulocytes 0.02 0.00 - 0.07 K/uL    Comment: Performed at [facility], [address] [place], [place] [address]  Pathologist smear review     Status: None   Collection Time: 01/18/19  2:17 PM  Result Value Ref Range   Path Review Peripheral blood smear is reviewed.     Comment: Within normal limits. Reviewed by Louanne Belton Rodney Cruise, M.D. Performed at [facility], [address] [place], [place] [address]   Coag Studies Interp Report     Status: None   Collection Time: 01/18/19  2:17 PM  Result Value Ref Range   Interpretation Note     Comment: (NOTE) ------------------------------- COAGULATION: VON WILLEBRAND FACTOR ASSESSMENT CURRENT RESULTS ASSESSMENT The VWF:Ag is normal. The VWF:RCo is normal. The FVIII is elevated. VON WILLEBRAND FACTOR ASSESSMENT CURRENT RESULTS INTERPRETATION - These results are not consistent with a diagnosis of VWD according to  the current  NHLBI guideline. Persistently elevated FVIII activity is a risk factor for venous thrombosis as well as recurrence of venous thrombosis. Risk is graded and increases with the degree of elevation. Although elevated FVIII activity has been identified to cluster within families, a genetic basis for the elevation has not yet been elucidated (Br J Haematol. 2012; 157(6):653-663). VON WILLEBRAND FACTOR ASSESSMENT - Results may be falsely elevated and possibly falsely normal as VWF and FVIII may increase in pregnancy, in samples drawn from patients (particularly children) who are visibly stressed at the time of phlebotomy, as acute phase reactants , or in response to certain drug therapies such as desmopressin. Repeat testing may be necessary before excluding a diagnosis of VWD especially if the clinical suspicion is high for an underlying bleeding disorder. The setting for phlebotomy should be as calm as possible and patients should be encouraged to sit quietly prior to the blood draw. VON WILLEBRAND FACTOR ASSESSMENT DEFINITIONS - VWD - von Willebrand disease; VWF - von Willebrand factor; VWF:Ag - VWF antigen; VWF:RCo - VWF ristocetin cofactor activity; FVIII - factor VIII activity. - MEDICAL DIRECTOR: For questions regarding panel interpretation, please contact Jake Bathe, M.D. at [facility] at [phone]. ------------------------------- DISCLAIMER These assessments and interpretations are provided as a convenience in support of the physician-patient relationship and are not intended to replace the physician's clinical judgment. They are derived from national guidelines in addit ion to other evidence and expert opinion. The clinician should consider this information within the context of clinical opinion and the individual patient. SEE GUIDANCE FOR VON WILLEBRAND FACTOR ASSESSMENT: (1) The National Heart, Lung and Blood Institute. The  Diagnosis, Evaluation and Management of von Willebrand Disease. Janeal Holmes, MD: [place] Publication 96-7893. 8101. Available at vSpecials.com.pt. (2) Daryl Eastern et al. [place] J Hematol. 2009; 84(6):366-370. (3) [place]. 2004;10(3):199-217. (4) Pasi KJ et al. Haemophilia. 2004; 10(3):218-231. Performed At: [facility] [place], [place] [id number] Thomasene Ripple MD DP:[id number]    Objective  Body mass index is 28.52 kg/m. Wt Readings from Last 3 Encounters:  02/02/19 198 lb 12.8 oz (90.2 kg)  02/01/19 197 lb 12 oz (89.7 kg)  01/18/19 197 lb 13.8 oz (89.8 kg)   Temp Readings from Last 3 Encounters:  02/02/19 98 F (36.7 C) (Oral)  02/01/19 98.2 F (36.8 C) (Oral)  01/18/19 99.6 F (37.6 C) (Tympanic)   BP Readings from Last 3 Encounters:  02/02/19 116/68  02/01/19 123/82  01/18/19 115/71   Pulse Readings from Last 3 Encounters:  02/02/19 78  02/01/19 74  01/18/19 75    Physical Exam Vitals signs and nursing note reviewed. Exam conducted with a chaperone present.  Constitutional:      Appearance: Normal appearance. She is well-developed and well-groomed.  HENT:     Head: Normocephalic and atraumatic.     Nose: Nose normal.     Mouth/Throat:     Mouth: Mucous membranes are moist.     Pharynx: Oropharynx is clear.  Eyes:     Conjunctiva/sclera: Conjunctivae normal.     Pupils: Pupils are equal, round, and reactive to light.  Cardiovascular:     Rate and Rhythm: Normal rate and regular rhythm.     Heart sounds: Normal heart sounds. No murmur.  Pulmonary:     Effort: Pulmonary effort is normal.     Breath sounds: Normal breath sounds.  Chest:     Chest wall: No mass.  Breasts: Breasts are symmetrical.        Right: No swelling, bleeding, inverted nipple, mass, nipple discharge, skin change or tenderness.        Left: Inverted nipple present. No swelling, bleeding,  mass, nipple discharge, skin change or tenderness.    Genitourinary:    Pubic Area: No rash.      Labia:        Right: No rash.        Left: No rash.      Vagina: Normal.     Cervix: Discharge present.     Uterus: Normal.      Adnexa: Right adnexa normal and left adnexa normal.  Lymphadenopathy:     Upper Body:     Right upper body: No axillary adenopathy.     Left upper body: No axillary adenopathy.  Skin:    General: Skin is warm and dry.  Neurological:     General: No focal deficit present.     Mental Status: She is alert and oriented to person, place, and time. Mental status is at baseline.     Gait: Gait normal.  Psychiatric:        Attention and Perception: Attention and perception normal.        Mood and Affect: Mood and affect normal.        Speech: Speech normal.        Behavior: Behavior normal. Behavior is cooperative.        Thought Content: Thought content normal.        Cognition and Memory: Cognition and memory normal.        Judgment: Judgment normal.     Assessment   1. Annual exam  2. Chronic insomnia  Plan   1.  Flu shot utd  Check ob/gyn Tdap Dr. Garwin Brothers  Pap from 10/2017 Dr. 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 Reduce ambien to 5 mg qhs  Does not currently want to f/u with psych for this issue  Provider: Dr. Olivia Mackie McLean-Scocuzza-Internal Medicine

## 2019-02-02 NOTE — Progress Notes (Signed)
Pre visit review using our clinic review tool, if applicable. No additional management support is needed unless otherwise documented below in the visit note. 

## 2019-02-04 LAB — CERVICOVAGINAL ANCILLARY ONLY
Bacterial vaginitis: NEGATIVE
Candida vaginitis: NEGATIVE
Chlamydia: NEGATIVE
Neisseria Gonorrhea: NEGATIVE
Trichomonas: NEGATIVE

## 2019-02-07 ENCOUNTER — Encounter: Payer: Self-pay | Admitting: Internal Medicine

## 2019-02-09 LAB — CYTOLOGY - PAP
Diagnosis: NEGATIVE
HPV: NOT DETECTED

## 2019-02-09 NOTE — Progress Notes (Signed)
Pt not in [employer]

## 2019-05-23 ENCOUNTER — Encounter: Payer: Self-pay | Admitting: Hematology and Oncology

## 2019-08-05 ENCOUNTER — Other Ambulatory Visit: Payer: Self-pay | Admitting: Internal Medicine

## 2019-08-05 DIAGNOSIS — G4700 Insomnia, unspecified: Secondary | ICD-10-CM

## 2019-08-05 MED ORDER — ZOLPIDEM TARTRATE 5 MG PO TABS: 5.0000 mg | ORAL_TABLET | Freq: Every day | ORAL | 5 refills | Status: DC

## 2019-09-02 ENCOUNTER — Telehealth: Payer: Self-pay

## 2019-09-02 NOTE — Telephone Encounter (Signed)
Copied from [place] [id number]. Topic: General - Other >> Sep 02, 2019 10:46 AM Rutherford Nail, NT wrote: Reason for CRM: Patient returning a call to [place] to go over [place] questions for her appointment tomorrow. Attempted office and was placed directly on hold. Held for 3 mins with pick up. Advised patient that office would have to reach back out.

## 2019-09-03 ENCOUNTER — Encounter: Payer: Self-pay | Admitting: Internal Medicine

## 2019-09-03 ENCOUNTER — Ambulatory Visit (INDEPENDENT_AMBULATORY_CARE_PROVIDER_SITE_OTHER): Payer: 59 | Admitting: Internal Medicine

## 2019-09-03 ENCOUNTER — Other Ambulatory Visit: Payer: Self-pay

## 2019-09-03 VITALS — BP 120/76 | HR 78 | Temp 98.2°F | Ht 70.0 in | Wt 194.4 lb

## 2019-09-03 DIAGNOSIS — Z8659 Personal history of other mental and behavioral disorders: Secondary | ICD-10-CM

## 2019-09-03 DIAGNOSIS — F419 Anxiety disorder, unspecified: Secondary | ICD-10-CM

## 2019-09-03 DIAGNOSIS — Z658 Other specified problems related to psychosocial circumstances: Secondary | ICD-10-CM

## 2019-09-03 DIAGNOSIS — F5104 Psychophysiologic insomnia: Secondary | ICD-10-CM | POA: Diagnosis not present

## 2019-09-03 HISTORY — DX: Anxiety disorder, unspecified: F41.9

## 2019-09-03 MED ORDER — HYDROXYZINE HCL 50 MG PO TABS: 50.0000 mg | ORAL_TABLET | Freq: Three times a day (TID) | ORAL | 0 refills | Status: DC | PRN

## 2019-09-03 MED ORDER — CLONAZEPAM 0.5 MG PO TABS: 0.5000 mg | ORAL_TABLET | Freq: Every evening | ORAL | 0 refills | Status: DC | PRN

## 2019-09-03 NOTE — Patient Instructions (Addendum)
Stress relax brand L theanine or Tranquil sleep  Check with insurance to see which psychiatrist is in network   Dr. Toy Care -psychiatry in [facility] [place]  Dr. Nicolasa Ducking [facility] center in [place] back by 09/17/2019    0.5 daily as needed x 1 week week 2 every other day then then stop dose.   Hydroxyzine capsules or tablets What is this medicine? HYDROXYZINE (hye Machias i zeen) is an antihistamine. This medicine is used to treat allergy symptoms. It is also used to treat anxiety and tension. This medicine can be used with other medicines to induce sleep before surgery. This medicine may be used for other purposes; ask your health care provider or pharmacist if you have questions. COMMON BRAND NAME(S): ANX, Atarax, Rezine, Vistaril What should I tell my health care provider before I take this medicine? They need to know if you have any of these conditions:  glaucoma  heart disease  history of irregular heartbeat  kidney disease  liver disease  lung or breathing disease, like asthma  stomach or intestine problems  thyroid disease  trouble passing urine  an unusual or allergic reaction to hydroxyzine, cetirizine, other medicines, foods, dyes or preservatives  pregnant or trying to get pregnant  breast-feeding How should I use this medicine? Take this medicine by mouth with a full glass of water. Follow the directions on the prescription label. You may take this medicine with food or on an empty stomach. Take your medicine at regular intervals. Do not take your medicine more often than directed. Talk to your pediatrician regarding the use of this medicine in children. Special care may be needed. While this drug may be prescribed for children as young as 71 years of age for selected conditions, precautions do apply. Patients over 39 years old may have a stronger reaction and need a smaller  dose. Overdosage: If you think you have taken too much of this medicine contact a poison control center or emergency room at once. NOTE: This medicine is only for you. Do not share this medicine with others. What if I miss a dose? If you miss a dose, take it as soon as you can. If it is almost time for your next dose, take only that dose. Do not take double or extra doses. What may interact with this medicine? Do not take this medicine with any of the following medications:  cisapride  dronedarone  pimozide  thioridazine This medicine may also interact with the following medications:  alcohol  antihistamines for allergy, cough, and cold  atropine  barbiturate medicines for sleep or seizures, like phenobarbital  certain antibiotics like erythromycin or clarithromycin  certain medicines for anxiety or sleep  certain medicines for bladder problems like oxybutynin, tolterodine  certain medicines for depression or psychotic disturbances  certain medicines for irregular heart beat  certain medicines for Parkinson's disease like benztropine, trihexyphenidyl  certain medicines for seizures like phenobarbital, primidone  certain medicines for stomach problems like dicyclomine, hyoscyamine  certain medicines for travel sickness like scopolamine  ipratropium  narcotic medicines for pain  other medicines that prolong the QT interval (an abnormal heart rhythm) like dofetilide This list may not describe all possible interactions. Give your health care provider a list of all the medicines, herbs, non-prescription drugs, or dietary supplements you use. Also tell them if you smoke, drink alcohol, or use illegal drugs. Some items may interact with your medicine. What should I watch  for while using this medicine? Tell your doctor or health care professional if your symptoms do not improve. You may get drowsy or dizzy. Do not drive, use machinery, or do anything that needs mental  alertness until you know how this medicine affects you. Do not stand or sit up quickly, especially if you are an older patient. This reduces the risk of dizzy or fainting spells. Alcohol may interfere with the effect of this medicine. Avoid alcoholic drinks. Your mouth may get dry. Chewing sugarless gum or sucking hard candy, and drinking plenty of water may help. Contact your doctor if the problem does not go away or is severe. This medicine may cause dry eyes and blurred vision. If you wear contact lenses you may feel some discomfort. Lubricating drops may help. See your eye doctor if the problem does not go away or is severe. If you are receiving skin tests for allergies, tell your doctor you are using this medicine. What side effects may I notice from receiving this medicine? Side effects that you should report to your doctor or health care professional as soon as possible:  allergic reactions like skin rash, itching or hives, swelling of the face, lips, or tongue  changes in vision  confusion  fast, irregular heartbeat  seizures  tremor  trouble passing urine or change in the amount of urine Side effects that usually do not require medical attention (report to your doctor or health care professional if they continue or are bothersome):  constipation  drowsiness  dry mouth  headache  tiredness This list may not describe all possible side effects. Call your doctor for medical advice about side effects. You may report side effects to FDA at 1-800-FDA-1088. Where should I keep my medicine? Keep out of the reach of children. Store at room temperature between 15 and 30 degrees C (59 and 86 degrees F). Keep container tightly closed. Throw away any unused medicine after the expiration date. NOTE: This sheet is a summary. It may not cover all possible information. If you have questions about this medicine, talk to your doctor, pharmacist, or health care provider.  2020 Elsevier/Gold  Standard (2018-11-16 13:19:55)   Buspirone tablets What is this medicine? BUSPIRONE (byoo SPYE rone) is used to treat anxiety disorders. This medicine may be used for other purposes; ask your health care provider or pharmacist if you have questions. COMMON BRAND NAME(S): BuSpar What should I tell my health care provider before I take this medicine? They need to know if you have any of these conditions:  kidney or liver disease  an unusual or allergic reaction to buspirone, other medicines, foods, dyes, or preservatives  pregnant or trying to get pregnant  breast-feeding How should I use this medicine? Take this medicine by mouth with a glass of water. Follow the directions on the prescription label. You may take this medicine with or without food. To ensure that this medicine always works the same way for you, you should take it either always with or always without food. Take your doses at regular intervals. Do not take your medicine more often than directed. Do not stop taking except on the advice of your doctor or health care professional. Talk to your pediatrician regarding the use of this medicine in children. Special care may be needed. Overdosage: If you think you have taken too much of this medicine contact a poison control center or emergency room at once. NOTE: This medicine is only for you. Do not share this medicine  with others. What if I miss a dose? If you miss a dose, take it as soon as you can. If it is almost time for your next dose, take only that dose. Do not take double or extra doses. What may interact with this medicine? Do not take this medicine with any of the following medications:  linezolid  MAOIs like Carbex, Eldepryl, Marplan, Nardil, and Parnate  methylene blue  procarbazine This medicine may also interact with the following medications:  diazepam  digoxin  diltiazem  erythromycin  grapefruit juice  haloperidol  medicines for mental  depression or mood problems  medicines for seizures like carbamazepine, phenobarbital and phenytoin  nefazodone  other medications for anxiety  rifampin  ritonavir  some antifungal medicines like itraconazole, ketoconazole, and voriconazole  verapamil  warfarin This list may not describe all possible interactions. Give your health care provider a list of all the medicines, herbs, non-prescription drugs, or dietary supplements you use. Also tell them if you smoke, drink alcohol, or use illegal drugs. Some items may interact with your medicine. What should I watch for while using this medicine? Visit your doctor or health care professional for regular checks on your progress. It may take 1 to 2 weeks before your anxiety gets better. You may get drowsy or dizzy. Do not drive, use machinery, or do anything that needs mental alertness until you know how this drug affects you. Do not stand or sit up quickly, especially if you are an older patient. This reduces the risk of dizzy or fainting spells. Alcohol can make you more drowsy and dizzy. Avoid alcoholic drinks. What side effects may I notice from receiving this medicine? Side effects that you should report to your doctor or health care professional as soon as possible:  blurred vision or other vision changes  chest pain  confusion  difficulty breathing  feelings of hostility or anger  muscle aches and pains  numbness or tingling in hands or feet  ringing in the ears  skin rash and itching  vomiting  weakness Side effects that usually do not require medical attention (report to your doctor or health care professional if they continue or are bothersome):  disturbed dreams, nightmares  headache  nausea  restlessness or nervousness  sore throat and nasal congestion  stomach upset This list may not describe all possible side effects. Call your doctor for medical advice about side effects. You may report side effects  to FDA at 1-800-FDA-1088. Where should I keep my medicine? Keep out of the reach of children. Store at room temperature below 30 degrees C (86 degrees F). Protect from light. Keep container tightly closed. Throw away any unused medicine after the expiration date. NOTE: This sheet is a summary. It may not cover all possible information. If you have questions about this medicine, talk to your doctor, pharmacist, or health care provider.  2020 Elsevier/Gold Standard (2010-07-05 18:06:11)    Living With Anxiety  After being diagnosed with an anxiety disorder, you may be relieved to know why you have felt or behaved a certain way. It is natural to also feel overwhelmed about the treatment ahead and what it will mean for your life. With care and support, you can manage this condition and recover from it. How to cope with anxiety Dealing with stress Stress is your body's reaction to life changes and events, both good and bad. Stress can last just a few hours or it can be ongoing. Stress can play a major role  in anxiety, so it is important to learn both how to cope with stress and how to think about it differently. Talk with your health care provider or a counselor to learn more about stress reduction. He or she may suggest some stress reduction techniques, such as:  Music therapy. This can include creating or listening to music that you enjoy and that inspires you.  Mindfulness-based meditation. This involves being aware of your normal breaths, rather than trying to control your breathing. It can be done while sitting or walking.  Centering prayer. This is a kind of meditation that involves focusing on a word, phrase, or sacred image that is meaningful to you and that brings you peace.  Deep breathing. To do this, expand your stomach and inhale slowly through your nose. Hold your breath for 3-5 seconds. Then exhale slowly, allowing your stomach muscles to relax.  Self-talk. This is a skill where  you identify thought patterns that lead to anxiety reactions and correct those thoughts.  Muscle relaxation. This involves tensing muscles then relaxing them. Choose a stress reduction technique that fits your lifestyle and personality. Stress reduction techniques take time and practice. Set aside 5-15 minutes a day to do them. Therapists can offer training in these techniques. The training may be covered by some insurance plans. Other things you can do to manage stress include:  Keeping a stress diary. This can help you learn what triggers your stress and ways to control your response.  Thinking about how you respond to certain situations. You may not be able to control everything, but you can control your reaction.  Making time for activities that help you relax, and not feeling guilty about spending your time in this way. Therapy combined with coping and stress-reduction skills provides the best chance for successful treatment. Medicines Medicines can help ease symptoms. Medicines for anxiety include:  Anti-anxiety drugs.  Antidepressants.  Beta-blockers. Medicines may be used as the main treatment for anxiety disorder, along with therapy, or if other treatments are not working. Medicines should be prescribed by a health care provider. Relationships Relationships can play a big part in helping you recover. Try to spend more time connecting with trusted friends and family members. Consider going to couples counseling, taking family education classes, or going to family therapy. Therapy can help you and others better understand the condition. How to recognize changes in your condition Everyone has a different response to treatment for anxiety. Recovery from anxiety happens when symptoms decrease and stop interfering with your daily activities at home or work. This may mean that you will start to:  Have better concentration and focus.  Sleep better.  Be less irritable.  Have more  energy.  Have improved memory. It is important to recognize when your condition is getting worse. Contact your health care provider if your symptoms interfere with home or work and you do not feel like your condition is improving. Where to find help and support: You can get help and support from these sources:  Self-help groups.  Online and [employer].  A trusted spiritual leader.  Couples counseling.  Family education classes.  Family therapy. Follow these instructions at home:  Eat a healthy diet that includes plenty of vegetables, fruits, whole grains, low-fat dairy products, and lean protein. Do not eat a lot of foods that are high in solid fats, added sugars, or salt.  Exercise. Most adults should do the following: ? Exercise for at least 150 minutes each week. The exercise  should increase your heart rate and make you sweat (moderate-intensity exercise). ? Strengthening exercises at least twice a week.  Cut down on caffeine, tobacco, alcohol, and other potentially harmful substances.  Get the right amount and quality of sleep. Most adults need 7-9 hours of sleep each night.  Make choices that simplify your life.  Take over-the-counter and prescription medicines only as told by your health care provider.  Avoid caffeine, alcohol, and certain over-the-counter cold medicines. These may make you feel worse. Ask your pharmacist which medicines to avoid.  Keep all follow-up visits as told by your health care provider. This is important. Questions to ask your health care provider  Would I benefit from therapy?  How often should I follow up with a health care provider?  How long do I need to take medicine?  Are there any long-term side effects of my medicine?  Are there any alternatives to taking medicine? Contact a health care provider if:  You have a hard time staying focused or finishing daily tasks.  You spend many hours a day feeling worried about  everyday life.  You become exhausted by worry.  You start to have headaches, feel tense, or have nausea.  You urinate more than normal.  You have diarrhea. Get help right away if:  You have a racing heart and shortness of breath.  You have thoughts of hurting yourself or others. If you ever feel like you may hurt yourself or others, or have thoughts about taking your own life, get help right away. You can go to your nearest emergency department or call:  Your local emergency services (911 in the [place]  A suicide crisis helpline, such as the [place] at 318 694 9731. This is open 24-hours a day. Summary  Taking steps to deal with stress can help calm you.  Medicines cannot cure anxiety disorders, but they can help ease symptoms.  Family, friends, and partners can play a big part in helping you recover from an anxiety disorder. This information is not intended to replace advice given to you by your health care provider. Make sure you discuss any questions you have with your health care provider. Document Released: 11/19/2016 Document Revised: 11/07/2017 Document Reviewed: 11/19/2016 Elsevier Patient Education  2020 [place].  Generalized Anxiety Disorder, Adult Generalized anxiety disorder (GAD) is a mental health disorder. People with this condition constantly worry about everyday events. Unlike normal anxiety, worry related to GAD is not triggered by a specific event. These worries also do not fade or get better with time. GAD interferes with life functions, including relationships, work, and school. GAD can vary from mild to severe. People with severe GAD can have intense waves of anxiety with physical symptoms (panic attacks). What are the causes? The exact cause of GAD is not known. What increases the risk? This condition is more likely to develop in:  Women.  People who have a family history of anxiety disorders.  People who are very  shy.  People who experience very stressful life events, such as the death of a loved one.  People who have a very stressful family environment. What are the signs or symptoms? People with GAD often worry excessively about many things in their lives, such as their health and family. They may also be overly concerned about:  Doing well at work.  Being on time.  Natural disasters.  Friendships. Physical symptoms of GAD include:  Fatigue.  Muscle tension or having muscle twitches.  Trembling or  feeling shaky.  Being easily startled.  Feeling like your heart is pounding or racing.  Feeling out of breath or like you cannot take a deep breath.  Having trouble falling asleep or staying asleep.  Sweating.  Nausea, diarrhea, or irritable bowel syndrome (IBS).  Headaches.  Trouble concentrating or remembering facts.  Restlessness.  Irritability. How is this diagnosed? Your health care provider can diagnose GAD based on your symptoms and medical history. You will also have a physical exam. The health care provider will ask specific questions about your symptoms, including how severe they are, when they started, and if they come and go. Your health care provider may ask you about your use of alcohol or drugs, including prescription medicines. Your health care provider may refer you to a mental health specialist for further evaluation. Your health care provider will do a thorough examination and may perform additional tests to rule out other possible causes of your symptoms. To be diagnosed with GAD, a person must have anxiety that:  Is out of his or her control.  Affects several different aspects of his or her life, such as work and relationships.  Causes distress that makes him or her unable to take part in normal activities.  Includes at least three physical symptoms of GAD, such as restlessness, fatigue, trouble concentrating, irritability, muscle tension, or sleep  problems. Before your health care provider can confirm a diagnosis of GAD, these symptoms must be present more days than they are not, and they must last for six months or longer. How is this treated? The following therapies are usually used to treat GAD:  Medicine. Antidepressant medicine is usually prescribed for long-term daily control. Antianxiety medicines may be added in severe cases, especially when panic attacks occur.  Talk therapy (psychotherapy). Certain types of talk therapy can be helpful in treating GAD by providing support, education, and guidance. Options include: ? Cognitive behavioral therapy (CBT). People learn coping skills and techniques to ease their anxiety. They learn to identify unrealistic or negative thoughts and behaviors and to replace them with positive ones. ? Acceptance and commitment therapy (ACT). This treatment teaches people how to be mindful as a way to cope with unwanted thoughts and feelings. ? Biofeedback. This process trains you to manage your body's response (physiological response) through breathing techniques and relaxation methods. You will work with a therapist while machines are used to monitor your physical symptoms.  Stress management techniques. These include yoga, meditation, and exercise. A mental health specialist can help determine which treatment is best for you. Some people see improvement with one type of therapy. However, other people require a combination of therapies. Follow these instructions at home:  Take over-the-counter and prescription medicines only as told by your health care provider.  Try to maintain a normal routine.  Try to anticipate stressful situations and allow extra time to manage them.  Practice any stress management or self-calming techniques as taught by your health care provider.  Do not punish yourself for setbacks or for not making progress.  Try to recognize your accomplishments, even if they are  small.  Keep all follow-up visits as told by your health care provider. This is important. Contact a health care provider if:  Your symptoms do not get better.  Your symptoms get worse.  You have signs of depression, such as: ? A persistently sad, cranky, or irritable mood. ? Loss of enjoyment in activities that used to bring you joy. ? Change in weight or eating. ?  Changes in sleeping habits. ? Avoiding friends or family members. ? Loss of energy for normal tasks. ? Feelings of guilt or worthlessness. Get help right away if:  You have serious thoughts about hurting yourself or others. If you ever feel like you may hurt yourself or others, or have thoughts about taking your own life, get help right away. You can go to your nearest emergency department or call:  Your local emergency services (911 in the [place]  A suicide crisis helpline, such as the [place] at (502)850-0053. This is open 24 hours a day. Summary  Generalized anxiety disorder (GAD) is a mental health disorder that involves worry that is not triggered by a specific event.  People with GAD often worry excessively about many things in their lives, such as their health and family.  GAD may cause physical symptoms such as restlessness, trouble concentrating, sleep problems, frequent sweating, nausea, diarrhea, headaches, and trembling or muscle twitching.  A mental health specialist can help determine which treatment is best for you. Some people see improvement with one type of therapy. However, other people require a combination of therapies. This information is not intended to replace advice given to you by your health care provider. Make sure you discuss any questions you have with your health care provider. Document Released: 03/22/2013 Document Revised: 11/07/2017 Document Reviewed: 10/15/2016 Elsevier Patient Education  2020 [employer].

## 2019-09-03 NOTE — Progress Notes (Signed)
Chief Complaint  Patient presents with  . Follow-up   Anxiety worse GAD score 17 prev has seen psych and therapy since 38 y.o and has not tolerated several meds zoloft, lexapro caused sexual side effects, cymbalta did not help, abilify caused hallucinations she also has chronic insomnia  Her insurance will not cover prior psych in [place]. She feels increased panic worry, unsettle, increased HR and dizziness and has found old Rx klonopin was on 2 mg bid years ago then down to 1 mg bid now taking 1 mg qd but does not want to take this due to being addictive    Review of Systems  Constitutional: Negative for weight loss.  HENT: Negative for hearing loss.   Eyes: Negative for blurred vision.  Respiratory: Negative for shortness of breath.   Cardiovascular: Negative for chest pain.  Gastrointestinal: Negative for abdominal pain.  Skin: Negative for rash.  Psychiatric/Behavioral: The patient is nervous/anxious.    Past Medical History:  Diagnosis Date  . Anemia   . Chronic insomnia   . IUD complication ([place])    spontaneously came out while on honeymoon  . Migraine   . Sinus infection   . Tourette syndrome   . Urinary tract infection    Past Surgical History:  Procedure Laterality Date  . WISDOM TOOTH EXTRACTION     Family History  Problem Relation Age of Onset  . Depression Mother   . Anxiety disorder Mother   . Heart disease Father   . Hypertension Father   . Diabetes Father   . Tourette syndrome Father    Social History   Socioeconomic History  . Marital status: Single    Spouse name: Not on file  . Number of children: Not on file  . Years of education: Not on file  . Highest education level: Not on file  Occupational History  . Not on file  Social Needs  . Financial resource strain: Not on file  . Food insecurity    Worry: Not on file    Inability: Not on file  . Transportation needs    Medical: Not on file    Non-medical: Not on file  Tobacco Use  .  Smoking status: Former Research scientist (life sciences)  . Smokeless tobacco: Never Used  . Tobacco comment: E-cigarette  Substance and Sexual Activity  . Alcohol use: No  . Drug use: Not on file  . Sexual activity: Not on file  Lifestyle  . Physical activity    Days per week: Not on file    Minutes per session: Not on file  . Stress: Not on file  Relationships  . Social Herbalist on phone: Not on file    Gets together: Not on file    Attends religious service: Not on file    Active member of club or organization: Not on file    Attends meetings of clubs or organizations: Not on file    Relationship status: Not on file  . Intimate partner violence    Fear of current or ex partner: Not on file    Emotionally abused: Not on file    Physically abused: Not on file    Forced sexual activity: Not on file  Other Topics Concern  . Not on file  Social History Narrative   Lives in [place].      Works - [employer]   Current Meds  Medication Sig  . Ascorbic Acid (VITAMIN  C) 1000 MG tablet Take 1,000 mg by mouth daily.  . Cholecalciferol 1.25 MG (50000 UT) capsule Take 1 capsule (50,000 Units total) by mouth once a week.  . ferrous sulfate 325 (65 FE) MG tablet Take by mouth daily.  . vitamin B-12 (CYANOCOBALAMIN) 1000 MCG tablet Take 1,000 mcg by mouth daily.  Marland Kitchen zolpidem (AMBIEN) 5 MG tablet Take 1 tablet (5 mg total) by mouth at bedtime. prn   Allergies  Allergen Reactions  . Abilify [Aripiprazole]     Hallucinations    . Augmentin [Amoxicillin-Pot Clavulanate] Nausea Only   No results found for this or any previous visit (from the past 2160 hour(s)). Objective  Body mass index is 27.89 kg/m. Wt Readings from Last 3 Encounters:  09/03/19 194 lb 6.4 oz (88.2 kg)  02/02/19 198 lb 12.8 oz (90.2 kg)  02/01/19 197 lb 12 oz (89.7 kg)   Temp Readings from Last 3 Encounters:  09/03/19 98.2 F (36.8 C) (Oral)  02/02/19 98 F (36.7 C) (Oral)  02/01/19 98.2  F (36.8 C) (Oral)   BP Readings from Last 3 Encounters:  09/03/19 120/76  02/02/19 116/68  02/01/19 123/82   Pulse Readings from Last 3 Encounters:  09/03/19 78  02/02/19 78  02/01/19 74    Physical Exam Vitals signs and nursing note reviewed.  Constitutional:      Appearance: Normal appearance.     Comments: +mask on   HENT:     Head: Normocephalic and atraumatic.  Cardiovascular:     Rate and Rhythm: Normal rate and regular rhythm.     Heart sounds: No murmur.  Pulmonary:     Effort: Pulmonary effort is normal.     Breath sounds: Normal breath sounds.  Skin:    General: Skin is warm and dry.  Neurological:     General: No focal deficit present.     Mental Status: She is alert and oriented to person, place, and time. Mental status is at baseline.  Psychiatric:        Mood and Affect: Mood normal.        Behavior: Behavior normal.        Thought Content: Thought content normal.        Judgment: Judgment normal.     Assessment  Plan  Anxiety - Plan: clonazePAM (KLONOPIN) 0.5 MG tablet taper off over the next couple weeks 1 pill qd x 1 week then qod x 1 week then off, hydrOXYzine (ATARAX/VISTARIL) 50 MG tablet qhs prn then tid if needed   If does not like can try buspar Given psych resources  Chronic insomnia  History of panic disorder  Provider: Dr. Olivia Mackie McLean-Scocuzza-Internal Medicine

## 2019-09-17 ENCOUNTER — Encounter: Payer: Self-pay | Admitting: Internal Medicine

## 2019-10-26 ENCOUNTER — Other Ambulatory Visit: Payer: Self-pay | Admitting: Internal Medicine

## 2019-10-26 DIAGNOSIS — F419 Anxiety disorder, unspecified: Secondary | ICD-10-CM

## 2019-10-26 MED ORDER — HYDROXYZINE HCL 50 MG PO TABS: 50.0000 mg | ORAL_TABLET | Freq: Three times a day (TID) | ORAL | 11 refills | Status: DC | PRN

## 2019-12-16 ENCOUNTER — Encounter: Payer: Self-pay | Admitting: Internal Medicine

## 2019-12-16 ENCOUNTER — Other Ambulatory Visit: Payer: Self-pay

## 2019-12-16 ENCOUNTER — Ambulatory Visit (INDEPENDENT_AMBULATORY_CARE_PROVIDER_SITE_OTHER): Payer: 59 | Admitting: Internal Medicine

## 2019-12-16 ENCOUNTER — Telehealth: Payer: Self-pay | Admitting: Internal Medicine

## 2019-12-16 VITALS — Ht 71.0 in | Wt 187.0 lb

## 2019-12-16 DIAGNOSIS — Z13220 Encounter for screening for lipoid disorders: Secondary | ICD-10-CM | POA: Diagnosis not present

## 2019-12-16 DIAGNOSIS — E559 Vitamin D deficiency, unspecified: Secondary | ICD-10-CM

## 2019-12-16 DIAGNOSIS — Z1329 Encounter for screening for other suspected endocrine disorder: Secondary | ICD-10-CM

## 2019-12-16 DIAGNOSIS — H01136 Eczematous dermatitis of left eye, unspecified eyelid: Secondary | ICD-10-CM

## 2019-12-16 DIAGNOSIS — F419 Anxiety disorder, unspecified: Secondary | ICD-10-CM

## 2019-12-16 DIAGNOSIS — Z0000 Encounter for general adult medical examination without abnormal findings: Secondary | ICD-10-CM | POA: Diagnosis not present

## 2019-12-16 DIAGNOSIS — Z1389 Encounter for screening for other disorder: Secondary | ICD-10-CM

## 2019-12-16 DIAGNOSIS — E611 Iron deficiency: Secondary | ICD-10-CM

## 2019-12-16 DIAGNOSIS — G4700 Insomnia, unspecified: Secondary | ICD-10-CM

## 2019-12-16 MED ORDER — ZOLPIDEM TARTRATE 5 MG PO TABS: 5.0000 mg | ORAL_TABLET | Freq: Every day | ORAL | 5 refills | Status: DC

## 2019-12-16 NOTE — Progress Notes (Addendum)
telephone Note failed doxy  I connected with Kelsey Mcguire   on 12/16/19 at  9:10 AM EST telephone and verified that I am speaking with the correct person using two identifiers.  Location patient: home Location provider:work or home office Persons participating in the virtual visit: patient, provider  I discussed the limitations of evaluation and management by telemedicine and the availability of in person appointments. The patient expressed understanding and agreed to proceed.   HPI: 1. Anxiety/chronic insomnia doing better on ambien 5 mg qhs and vistaril 50 mg qhs only reason sleep is not better is due to daughter having interrupted sleep and she is sleeping 4 to 7 hours at night and cant fall back asleep if woken up. She has tried meditation essential oil. She will contact daughters pediatrician for better sleep for daughter  She only had to take klonopin 0.5 1x due to having to get a root canal  2. Left red undereye dry patch tried essential oil blend which helped a little with dryness and she has sensitive skin  3. Vitamin D and iron def she is on D3 2500 qd and wants her iron checked again   ROS: See pertinent positives and negatives per HPI.  Past Medical History:  Diagnosis Date  . Anemia   . Chronic insomnia   . IUD complication ([place])    spontaneously came out while on honeymoon  . Migraine   . Sinus infection   . Tourette syndrome   . Urinary tract infection     Past Surgical History:  Procedure Laterality Date  . WISDOM TOOTH EXTRACTION      Family History  Problem Relation Age of Onset  . Depression Mother   . Anxiety disorder Mother   . Heart disease Father   . Hypertension Father   . Diabetes Father   . Tourette syndrome Father     SOCIAL HX:  Lives in [place].  Works - [employer]   Current Outpatient Medications:  .  Ascorbic Acid (VITAMIN C) 1000 MG tablet, Take 1,000 mg by mouth daily., Disp: , Rfl:  .   Cholecalciferol 1.25 MG (50000 UT) capsule, Take 1 capsule (50,000 Units total) by mouth once a week., Disp: 13 capsule, Rfl: 1 .  clonazePAM (KLONOPIN) 0.5 MG tablet, Take 1 tablet (0.5 mg total) by mouth at bedtime as needed for anxiety., Disp: 30 tablet, Rfl: 0 .  ferrous sulfate 325 (65 FE) MG tablet, Take by mouth daily., Disp: , Rfl:  .  hydrOXYzine (ATARAX/VISTARIL) 50 MG tablet, Take 1 tablet (50 mg total) by mouth 3 (three) times daily as needed., Disp: 90 tablet, Rfl: 11 .  vitamin B-12 (CYANOCOBALAMIN) 1000 MCG tablet, Take 1,000 mcg by mouth daily., Disp: , Rfl:  .  zolpidem (AMBIEN) 5 MG tablet, Take 1 tablet (5 mg total) by mouth at bedtime. prn, Disp: 30 tablet, Rfl: 5  EXAM:  VITALS per patient if applicable:  GENERAL: alert, oriented, appears well and in no acute distress  HEENT: atraumatic, conjunttiva clear, no obvious abnormalities on inspection of external nose and ears  NECK: normal movements of the head and neck  LUNGS: on inspection no signs of respiratory distress, breathing rate appears normal, no obvious gross SOB, gasping or wheezing  CV: no obvious cyanosis  MS: moves all visible extremities without noticeable abnormality  PSYCH/NEURO: pleasant and cooperative, no obvious depression or anxiety, speech and thought processing grossly intact  ASSESSMENT AND PLAN:  Discussed the following assessment  and plan:  Anxiety/insomnia  Plan: zolpidem (AMBIEN) 5 MG tablet, vistaril 50 mg qhs  Contact daughters pediatrician to try to get daughter to rest better at night   Vitamin D deficiency - Plan: Vitamin D (25 hydroxy) d3 2500 mg qd   Iron deficiency - Plan: Iron, TIBC and Ferritin Panel  Eczema of left under eyelid  Try cetaphil or cerave otc with hc 1% cream bid otc   HM Flu shot had 2019 not sure if had 2019  Check ob/gyn Tdap Dr. Garwin Brothers had 06/15/17 Pap Dr. Garwin Brothers h/o abnormal and HPV in 2012  -->Pap 02/02/19 neg HPV neg pap  D3 50K weekly x 6 months  then D3 2500 IU daily drs best  -we discussed possible serious and likely etiologies, options for evaluation and workup, limitations of telemedicine visit vs in person visit, treatment, treatment risks and precautions. Pt prefers to treat via telemedicine empirically rather then risking or undertaking an in person visit at this moment. Patient agrees to seek prompt in person care if worsening, new symptoms arise, or if is not improving with treatment.   I discussed the assessment and treatment plan with the patient. The patient was provided an opportunity to ask questions and all were answered. The patient agreed with the plan and demonstrated an understanding of the instructions.   The patient was advised to call back or seek an in-person evaluation if the symptoms worsen or if the condition fails to improve as anticipated.  Time spent 20-29 minutes  Delorise Jackson, MD

## 2019-12-16 NOTE — Telephone Encounter (Signed)
I called pt and left a vm to call ofc to schedule Return for sch fasting labs 01/09/20 and f/u in 6-12 months sooner prn  And schedule RN visit for flu shot as well if wants on day of labs

## 2019-12-16 NOTE — Telephone Encounter (Signed)
Pt called back all appts have been scheduled.

## 2019-12-17 ENCOUNTER — Other Ambulatory Visit: Payer: 59

## 2020-01-12 ENCOUNTER — Other Ambulatory Visit: Payer: Self-pay

## 2020-01-12 ENCOUNTER — Ambulatory Visit (INDEPENDENT_AMBULATORY_CARE_PROVIDER_SITE_OTHER): Payer: 59 | Admitting: *Deleted

## 2020-01-12 ENCOUNTER — Other Ambulatory Visit (INDEPENDENT_AMBULATORY_CARE_PROVIDER_SITE_OTHER): Payer: 59

## 2020-01-12 DIAGNOSIS — Z0000 Encounter for general adult medical examination without abnormal findings: Secondary | ICD-10-CM

## 2020-01-12 DIAGNOSIS — Z1329 Encounter for screening for other suspected endocrine disorder: Secondary | ICD-10-CM

## 2020-01-12 DIAGNOSIS — E611 Iron deficiency: Secondary | ICD-10-CM

## 2020-01-12 DIAGNOSIS — Z13220 Encounter for screening for lipoid disorders: Secondary | ICD-10-CM

## 2020-01-12 DIAGNOSIS — Z1389 Encounter for screening for other disorder: Secondary | ICD-10-CM

## 2020-01-12 DIAGNOSIS — E559 Vitamin D deficiency, unspecified: Secondary | ICD-10-CM | POA: Diagnosis not present

## 2020-01-12 DIAGNOSIS — Z23 Encounter for immunization: Secondary | ICD-10-CM | POA: Diagnosis not present

## 2020-01-12 LAB — COMPREHENSIVE METABOLIC PANEL
ALT: 9 U/L (ref 0–35)
AST: 11 U/L (ref 0–37)
Albumin: 4.1 g/dL (ref 3.5–5.2)
Alkaline Phosphatase: 62 U/L (ref 39–117)
BUN: 11 mg/dL (ref 6–23)
CO2: 27 mEq/L (ref 19–32)
Calcium: 9.1 mg/dL (ref 8.4–10.5)
Chloride: 104 mEq/L (ref 96–112)
Creatinine, Ser: 0.66 mg/dL (ref 0.40–1.20)
GFR: 99.8 mL/min (ref >60.00)
Glucose, Bld: 100 mg/dL — ABNORMAL HIGH (ref 70–99)
Potassium: 3.9 mEq/L (ref 3.5–5.1)
Sodium: 137 mEq/L (ref 135–145)
Total Bilirubin: 0.5 mg/dL (ref 0.2–1.2)
Total Protein: 6.9 g/dL (ref 6.0–8.3)

## 2020-01-12 LAB — LIPID PANEL
Cholesterol: 156 mg/dL (ref 0–200)
HDL: 53.6 mg/dL (ref >39.00)
LDL Cholesterol: 86 mg/dL (ref 0–99)
NonHDL: 102.55
Total CHOL/HDL Ratio: 3
Triglycerides: 82 mg/dL (ref 0.0–149.0)
VLDL: 16.4 mg/dL (ref 0.0–40.0)

## 2020-01-12 LAB — CBC WITH DIFFERENTIAL/PLATELET
Basophils Absolute: 0 10*3/uL (ref 0.0–0.1)
Basophils Relative: 0.5 % (ref 0.0–3.0)
Eosinophils Absolute: 0.1 10*3/uL (ref 0.0–0.7)
Eosinophils Relative: 2.3 % (ref 0.0–5.0)
HCT: 41.3 % (ref 36.0–46.0)
Hemoglobin: 13.7 g/dL (ref 12.0–15.0)
Lymphocytes Relative: 25 % (ref 12.0–46.0)
Lymphs Abs: 1.5 10*3/uL (ref 0.7–4.0)
MCHC: 33.1 g/dL (ref 30.0–36.0)
MCV: 91.3 fl (ref 78.0–100.0)
Monocytes Absolute: 0.3 10*3/uL (ref 0.1–1.0)
Monocytes Relative: 5.2 % (ref 3.0–12.0)
Neutro Abs: 4.1 10*3/uL (ref 1.4–7.7)
Neutrophils Relative %: 67 % (ref 43.0–77.0)
Platelets: 286 10*3/uL (ref 150.0–400.0)
RBC: 4.53 Mil/uL (ref 3.87–5.11)
RDW: 12.6 % (ref 11.5–15.5)
WBC: 6.1 10*3/uL (ref 4.0–10.5)

## 2020-01-12 LAB — VITAMIN D 25 HYDROXY (VIT D DEFICIENCY, FRACTURES): VITD: 28.49 ng/mL — ABNORMAL LOW (ref 30.00–100.00)

## 2020-01-12 LAB — TSH: TSH: 2.22 u[IU]/mL (ref 0.35–4.50)

## 2020-01-13 ENCOUNTER — Encounter: Payer: Self-pay | Admitting: Internal Medicine

## 2020-01-13 ENCOUNTER — Other Ambulatory Visit: Payer: 59

## 2020-01-13 ENCOUNTER — Other Ambulatory Visit: Payer: Self-pay | Admitting: Internal Medicine

## 2020-01-13 DIAGNOSIS — E559 Vitamin D deficiency, unspecified: Secondary | ICD-10-CM

## 2020-01-13 LAB — URINALYSIS, ROUTINE W REFLEX MICROSCOPIC
Bacteria, UA: NONE SEEN /HPF
Bilirubin Urine: NEGATIVE
Glucose, UA: NEGATIVE
Hyaline Cast: NONE SEEN /LPF
Ketones, ur: NEGATIVE
Nitrite: NEGATIVE
Protein, ur: NEGATIVE
Specific Gravity, Urine: 1.005 (ref 1.001–1.03)
Squamous Epithelial / HPF: NONE SEEN /HPF (ref <=5)
pH: 7.5 (ref 5.0–8.0)

## 2020-01-13 LAB — IRON,TIBC AND FERRITIN PANEL
%SAT: 23 % (calc) (ref 16–45)
Ferritin: 46 ng/mL (ref 16–154)
Iron: 78 ug/dL (ref 40–190)
TIBC: 332 mcg/dL (calc) (ref 250–450)

## 2020-01-13 MED ORDER — CHOLECALCIFEROL 1.25 MG (50000 UT) PO CAPS: 50000.0000 [IU] | ORAL_CAPSULE | ORAL | 1 refills | Status: DC

## 2020-03-07 ENCOUNTER — Encounter: Payer: Self-pay | Admitting: Internal Medicine

## 2020-03-07 ENCOUNTER — Telehealth (INDEPENDENT_AMBULATORY_CARE_PROVIDER_SITE_OTHER): Payer: 59 | Admitting: Internal Medicine

## 2020-03-07 VITALS — Ht 71.0 in | Wt 185.0 lb

## 2020-03-07 DIAGNOSIS — R5383 Other fatigue: Secondary | ICD-10-CM | POA: Diagnosis not present

## 2020-03-07 DIAGNOSIS — R519 Headache, unspecified: Secondary | ICD-10-CM | POA: Diagnosis not present

## 2020-03-07 DIAGNOSIS — J029 Acute pharyngitis, unspecified: Secondary | ICD-10-CM | POA: Diagnosis not present

## 2020-03-07 MED ORDER — AZITHROMYCIN 250 MG PO TABS: ORAL_TABLET | ORAL | 0 refills | Status: DC

## 2020-03-07 NOTE — Progress Notes (Signed)
Virtual Visit via Video Note  I connected with Kelsey Mcguire  on 03/07/20 at 11:50 AM EDT by a video enabled telemedicine application and verified that I am speaking with the correct person using two identifiers.  Location patient: home Location provider:work or home office Persons participating in the virtual visit: patient, provider  I discussed the limitations of evaluation and management by telemedicine and the availability of in person appointments. The patient expressed understanding and agreed to proceed.   HPI: Sick visit since Friday/sat had fatigue, sore throat worse today 7/10 feels like lump in throat and hard to swallow and glands swollen went to get covid testing yesterday in [place] Throat no white spots but is red She is also having h/a, body aches, sat fever 100.3, chills today temp 97.7  She tried ibuprofen/tylenol with relief an an antihistamine w/o help   No sick contacts   Daughter nor husband sick    ROS: See pertinent positives and negatives per HPI.  Past Medical History:  Diagnosis Date  . Anemia   . Chronic insomnia   . IUD complication ([place])    spontaneously came out while on honeymoon  . Migraine   . Sinus infection   . Tourette syndrome   . Urinary tract infection     Past Surgical History:  Procedure Laterality Date  . WISDOM TOOTH EXTRACTION      Family History  Problem Relation Age of Onset  . Depression Mother   . Anxiety disorder Mother   . Heart disease Father   . Hypertension Father   . Diabetes Father   . Tourette syndrome Father     SOCIAL HX: married with 1 daughter    Current Outpatient Medications:  .  Ascorbic Acid (VITAMIN C) 1000 MG tablet, Take 1,000 mg by mouth daily., Disp: , Rfl:  .  Cholecalciferol 1.25 MG (50000 UT) capsule, Take 1 capsule (50,000 Units total) by mouth once a week., Disp: 13 capsule, Rfl: 1 .  clonazePAM (KLONOPIN) 0.5 MG tablet, Take 1 tablet (0.5 mg total) by mouth at bedtime as  needed for anxiety., Disp: 30 tablet, Rfl: 0 .  ferrous sulfate 325 (65 FE) MG tablet, Take by mouth daily., Disp: , Rfl:  .  hydrOXYzine (ATARAX/VISTARIL) 50 MG tablet, Take 1 tablet (50 mg total) by mouth 3 (three) times daily as needed., Disp: 90 tablet, Rfl: 11 .  vitamin B-12 (CYANOCOBALAMIN) 1000 MCG tablet, Take 1,000 mcg by mouth daily., Disp: , Rfl:  .  zolpidem (AMBIEN) 5 MG tablet, Take 1 tablet (5 mg total) by mouth at bedtime. prn, Disp: 30 tablet, Rfl: 5 .  azithromycin (ZITHROMAX) 250 MG tablet, 2 pills day 1, 1 pill day 2-5 with food, Disp: 6 tablet, Rfl: 0  EXAM:  VITALS per patient if applicable:  GENERAL: alert, oriented, appears well and in no acute distress  HEENT: atraumatic, conjunttiva clear, no obvious abnormalities on inspection of external nose and ears  NECK: normal movements of the head and neck  LUNGS: on inspection no signs of respiratory distress, breathing rate appears normal, no obvious gross SOB, gasping or wheezing  CV: no obvious cyanosis  MS: moves all visible extremities without noticeable abnormality  PSYCH/NEURO: pleasant and cooperative, no obvious depression or anxiety, speech and thought processing grossly intact  ASSESSMENT AND PLAN:  Discussed the following assessment and plan:  Sore throat - Plan: azithromycin (ZITHROMAX) 250 MG tablet Prn tylenol/ibuprofen  cepacol  Salt water gargles  Warm tea with honey/lemon  Zinc 100 mg, quercetin 250-500 mg bid, vitamin C 1000 mg qd, vitamin D 4000-5000 IU qd  covid test pending quarantine until result back   Nonintractable headache, unspecified chronicity pattern, unspecified headache type Fatigue, unspecified type Likely related to above see above   HM Flu shot had 01/12/20 Check ob/gyn Tdap Dr. Garwin Brothers had 06/15/17 Pap Dr. Garwin Brothers h/o abnormal and HPV in 2012 -->Pap 02/02/19 neg HPV neg pap  D3 50K weekly x 6 months then D3 2500 IU daily drs best  -we discussed possible serious and  likely etiologies, options for evaluation and workup, limitations of telemedicine visit vs in person visit, treatment, treatment risks and precautions. Pt prefers to treat via telemedicine empirically rather then risking or undertaking an in person visit at this moment. Patient agrees to seek prompt in person care if worsening, new symptoms arise, or if is not improving with treatment.   I discussed the assessment and treatment plan with the patient. The patient was provided an opportunity to ask questions and all were answered. The patient agreed with the plan and demonstrated an understanding of the instructions.   The patient was advised to call back or seek an in-person evaluation if the symptoms worsen or if the condition fails to improve as anticipated.  Time spent 20 minutes Delorise Jackson, MD

## 2020-03-08 ENCOUNTER — Encounter: Payer: Self-pay | Admitting: Internal Medicine

## 2020-06-14 ENCOUNTER — Encounter: Payer: Self-pay | Admitting: Internal Medicine

## 2020-06-14 ENCOUNTER — Ambulatory Visit (INDEPENDENT_AMBULATORY_CARE_PROVIDER_SITE_OTHER): Payer: 59 | Admitting: Internal Medicine

## 2020-06-14 ENCOUNTER — Other Ambulatory Visit: Payer: Self-pay

## 2020-06-14 VITALS — BP 122/80 | HR 77 | Temp 98.2°F | Ht 71.0 in | Wt 200.0 lb

## 2020-06-14 DIAGNOSIS — Z0000 Encounter for general adult medical examination without abnormal findings: Secondary | ICD-10-CM

## 2020-06-14 DIAGNOSIS — E559 Vitamin D deficiency, unspecified: Secondary | ICD-10-CM

## 2020-06-14 DIAGNOSIS — F419 Anxiety disorder, unspecified: Secondary | ICD-10-CM

## 2020-06-14 DIAGNOSIS — Z1231 Encounter for screening mammogram for malignant neoplasm of breast: Secondary | ICD-10-CM

## 2020-06-14 DIAGNOSIS — Z1329 Encounter for screening for other suspected endocrine disorder: Secondary | ICD-10-CM

## 2020-06-14 DIAGNOSIS — G4700 Insomnia, unspecified: Secondary | ICD-10-CM

## 2020-06-14 DIAGNOSIS — Z13220 Encounter for screening for lipoid disorders: Secondary | ICD-10-CM

## 2020-06-14 DIAGNOSIS — Z1389 Encounter for screening for other disorder: Secondary | ICD-10-CM

## 2020-06-14 MED ORDER — ZOLPIDEM TARTRATE 5 MG PO TABS: 5.0000 mg | ORAL_TABLET | Freq: Every day | ORAL | 5 refills | Status: DC

## 2020-06-14 MED ORDER — CLONAZEPAM 0.5 MG PO TABS: 0.5000 mg | ORAL_TABLET | Freq: Every evening | ORAL | 2 refills | Status: DC | PRN

## 2020-06-14 NOTE — Patient Instructions (Addendum)
COVID-19 Vaccine Information can be found at: ShippingScam.co.uk For questions related to vaccine distribution or appointments, please email vaccine@[email or web address] .[email or web address] or call [phone].    Vitamin D3 4000 to 5000  IU daily   Stress relax brand Tranquil Sleep serotonin, Melatonin, L theanine 1-2 at night  [employer]/whole foods     Intradermal nevus    Skin Tag, Adult  A skin tag (acrochordon) is a soft, extra growth of skin. Most skin tags are flesh-colored and rarely bigger than a pencil eraser. They commonly form near areas where there are folds in the skin, such as the armpit or groin. Skin tags are not dangerous, and they do not spread from person to person (are not contagious). You may have one skin tag or several. Skin tags do not require treatment. However, your health care provider may recommend removal of a skin tag if it:  Gets irritated from clothing.  Bleeds.  Is visible and unsightly. Your health care provider can remove skin tags with a simple surgical procedure or a procedure that involves freezing the skin tag. Follow these instructions at home:  Watch for any changes in your skin tag. A normal skin tag does not require any other special care at home.  Take over-the-counter and prescription medicines only as told by your health care provider.  Keep all follow-up visits as told by your health care provider. This is important. Contact a health care provider if:  You have a skin tag that: ? Becomes painful. ? Changes color. ? Bleeds. ? Swells.  You develop more skin tags. This information is not intended to replace advice given to you by your health care provider. Make sure you discuss any questions you have with your health care provider. Document Revised: 11/07/2017 Document Reviewed: 12/10/2015 Elsevier Patient Education  2020 [employer].

## 2020-06-14 NOTE — Progress Notes (Signed)
Chief Complaint  Patient presents with  . Follow-up   Annual doing well  1. Chronic insomnia on ambien 5 mg qhs and klonopin 0.5 prn for anxiety and hydroxyzine only taking 50 mg qhs prn and sleeping 5-6 to 8 hours at night  2. Vit D def advised D3 4000 to 5000 IU daily otc    Review of Systems  Constitutional: Negative for weight loss.  HENT: Negative for hearing loss.   Eyes: Negative for blurred vision.  Respiratory: Negative for shortness of breath.   Cardiovascular: Negative for chest pain.  Gastrointestinal: Negative for abdominal pain and blood in stool.  Skin: Negative for rash.  Psychiatric/Behavioral: Negative for depression. The patient is not nervous/anxious and does not have insomnia.    Past Medical History:  Diagnosis Date  . Anemia   . Chronic insomnia   . IUD complication ([place])    spontaneously came out while on honeymoon  . Migraine   . Preeclampsia   . Sinus infection   . Tourette syndrome   . Urinary tract infection    Past Surgical History:  Procedure Laterality Date  . WISDOM TOOTH EXTRACTION     Family History  Problem Relation Age of Onset  . Depression Mother   . Anxiety disorder Mother   . Heart disease Father   . Hypertension Father   . Diabetes Father   . Tourette syndrome Father   . Lymphoma Father        ? pre CLL vs other blood cancer 77/78   Social History   Socioeconomic History  . Marital status: Single    Spouse name: Not on file  . Number of children: Not on file  . Years of education: Not on file  . Highest education level: Not on file  Occupational History  . Not on file  Tobacco Use  . Smoking status: Former Research scientist (life sciences)  . Smokeless tobacco: Never Used  . Tobacco comment: E-cigarette  Vaping Use  . Vaping Use: Never used  Substance and Sexual Activity  . Alcohol use: No  . Drug use: Not on file  . Sexual activity: Not on file  Other Topics Concern  . Not on file  Social History Narrative   Lives in [place].       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 Difficulty of Paying Living Expenses:   Food Insecurity:   . Worried About Charity fundraiser in the Last Year:   . Arboriculturist in the Last Year:   Transportation Needs:   . Film/video editor (Medical):   Marland Kitchen Lack of Transportation (Non-Medical):   Physical Activity:   . Days of Exercise per Week:   . Minutes of Exercise per Session:   Stress:   . Feeling of Stress :   Social Connections:   . Frequency of Communication with Friends and Family:   . Frequency of Social Gatherings with Friends and Family:   . Attends Religious Services:   . Active Member of Clubs or Organizations:   . Attends Archivist Meetings:   Marland Kitchen Marital Status:   Intimate Partner Violence:   . Fear of Current or Ex-Partner:   . Emotionally Abused:   Marland Kitchen Physically Abused:   .  Sexually Abused:    Current Meds  Medication Sig  . Ascorbic Acid (VITAMIN C) 1000 MG tablet Take 1,000 mg by mouth daily.  . ferrous sulfate 325 (65 FE) MG tablet Take by mouth daily.  . hydrOXYzine (ATARAX/VISTARIL) 50 MG tablet Take 1 tablet (50 mg total) by mouth 3 (three) times daily as needed.  . vitamin B-12 (CYANOCOBALAMIN) 1000 MCG tablet Take 1,000 mcg by mouth daily.  Marland Kitchen zolpidem (AMBIEN) 5 MG tablet Take 1 tablet (5 mg total) by mouth at bedtime. prn  . [DISCONTINUED] Cholecalciferol 1.25 MG (50000 UT) capsule Take 1 capsule (50,000 Units total) by mouth once a week.  . [DISCONTINUED] zolpidem (AMBIEN) 5 MG tablet Take 1 tablet (5 mg total) by mouth at bedtime. prn   Allergies  Allergen Reactions  . Abilify [Aripiprazole]     Hallucinations    . Augmentin [Amoxicillin-Pot Clavulanate] Nausea Only   No results found for this or any previous visit (from the past 2160 hour(s)). Objective  Body mass index  is 27.89 kg/m. Wt Readings from Last 3 Encounters:  06/14/20 200 lb (90.7 kg)  03/07/20 185 lb (83.9 kg)  12/16/19 187 lb (84.8 kg)   Temp Readings from Last 3 Encounters:  06/14/20 98.2 F (36.8 C) (Oral)  09/03/19 98.2 F (36.8 C) (Oral)  02/02/19 98 F (36.7 C) (Oral)   BP Readings from Last 3 Encounters:  06/14/20 122/80  09/03/19 120/76  02/02/19 116/68   Pulse Readings from Last 3 Encounters:  06/14/20 77  09/03/19 78  02/02/19 78    Physical Exam Vitals and nursing note reviewed.  Constitutional:      Appearance: Normal appearance. She is well-developed, well-groomed and overweight.  HENT:     Head: Normocephalic and atraumatic.  Eyes:     Conjunctiva/sclera: Conjunctivae normal.     Pupils: Pupils are equal, round, and reactive to light.  Cardiovascular:     Rate and Rhythm: Normal rate and regular rhythm.     Heart sounds: Normal heart sounds. No murmur heard.   Pulmonary:     Effort: Pulmonary effort is normal.     Breath sounds: Normal breath sounds.  Skin:    General: Skin is warm and dry.       Neurological:     General: No focal deficit present.     Mental Status: She is alert and oriented to person, place, and time. Mental status is at baseline.     Gait: Gait normal.  Psychiatric:        Attention and Perception: Attention and perception normal.        Mood and Affect: Mood and affect normal.        Speech: Speech normal.        Behavior: Behavior normal. Behavior is cooperative.        Thought Content: Thought content normal.        Cognition and Memory: Cognition and memory normal.        Judgment: Judgment normal.     Assessment  Plan  Annual physical exam Flu shothad 01/12/20 No vaccine covid 19 declines  Tdap 06/15/17  Mammogram ordered 03/2021 Check ob/gyn Tdap Dr. Marge Duncans 06/15/17 Pap Dr. Garwin Brothers h/o abnormal and HPV in 2012 -->Pap2/25/20 neg HPV neg pap Colonoscopy age 4 rec.  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 Olivia Mackie McLean-Scocuzza-Internal Medicine

## 2020-06-14 NOTE — Progress Notes (Signed)
Patient flagged: Current status:  PATIENT IS OVERDUE FOR BMI FOLLOW UP PLAN BMI is estimated to be 27.9 based on the last recorded weight and height

## 2020-06-15 ENCOUNTER — Ambulatory Visit: Payer: 59 | Admitting: Internal Medicine

## 2020-08-28 ENCOUNTER — Encounter: Payer: Self-pay | Admitting: Internal Medicine

## 2021-01-01 ENCOUNTER — Telehealth: Payer: Self-pay | Admitting: Internal Medicine

## 2021-01-01 NOTE — Telephone Encounter (Signed)
Patient called in for refill forzolpidem (AMBIEN) 5 MG tablet

## 2021-01-02 ENCOUNTER — Other Ambulatory Visit: Payer: Self-pay | Admitting: Internal Medicine

## 2021-01-02 DIAGNOSIS — G4700 Insomnia, unspecified: Secondary | ICD-10-CM

## 2021-01-02 MED ORDER — ZOLPIDEM TARTRATE 5 MG PO TABS: 5.0000 mg | ORAL_TABLET | Freq: Every day | ORAL | 5 refills | Status: DC

## 2021-01-15 ENCOUNTER — Other Ambulatory Visit: Payer: 59

## 2021-01-25 ENCOUNTER — Other Ambulatory Visit (INDEPENDENT_AMBULATORY_CARE_PROVIDER_SITE_OTHER): Payer: 59

## 2021-01-25 ENCOUNTER — Other Ambulatory Visit: Payer: Self-pay

## 2021-01-25 DIAGNOSIS — E559 Vitamin D deficiency, unspecified: Secondary | ICD-10-CM

## 2021-01-25 DIAGNOSIS — Z1329 Encounter for screening for other suspected endocrine disorder: Secondary | ICD-10-CM

## 2021-01-25 DIAGNOSIS — Z0000 Encounter for general adult medical examination without abnormal findings: Secondary | ICD-10-CM

## 2021-01-25 DIAGNOSIS — Z1389 Encounter for screening for other disorder: Secondary | ICD-10-CM

## 2021-01-25 DIAGNOSIS — Z13220 Encounter for screening for lipoid disorders: Secondary | ICD-10-CM | POA: Diagnosis not present

## 2021-01-25 LAB — CBC WITH DIFFERENTIAL/PLATELET
Basophils Absolute: 0 K/uL (ref 0.0–0.1)
Basophils Relative: 0.5 % (ref 0.0–3.0)
Eosinophils Absolute: 0.2 K/uL (ref 0.0–0.7)
Eosinophils Relative: 2.5 % (ref 0.0–5.0)
HCT: 41.3 % (ref 36.0–46.0)
Hemoglobin: 13.7 g/dL (ref 12.0–15.0)
Lymphocytes Relative: 27.9 % (ref 12.0–46.0)
Lymphs Abs: 2 K/uL (ref 0.7–4.0)
MCHC: 33.1 g/dL (ref 30.0–36.0)
MCV: 89.8 fl (ref 78.0–100.0)
Monocytes Absolute: 0.4 K/uL (ref 0.1–1.0)
Monocytes Relative: 5.8 % (ref 3.0–12.0)
Neutro Abs: 4.6 K/uL (ref 1.4–7.7)
Neutrophils Relative %: 63.3 % (ref 43.0–77.0)
Platelets: 267 K/uL (ref 150.0–400.0)
RBC: 4.6 Mil/uL (ref 3.87–5.11)
RDW: 13.6 % (ref 11.5–15.5)
WBC: 7.3 K/uL (ref 4.0–10.5)

## 2021-01-25 LAB — LIPID PANEL
Cholesterol: 200 mg/dL (ref 0–200)
HDL: 59.9 mg/dL
LDL Cholesterol: 113 mg/dL — ABNORMAL HIGH (ref 0–99)
NonHDL: 140.5
Total CHOL/HDL Ratio: 3
Triglycerides: 138 mg/dL (ref 0.0–149.0)
VLDL: 27.6 mg/dL (ref 0.0–40.0)

## 2021-01-25 LAB — COMPREHENSIVE METABOLIC PANEL
ALT: 14 U/L (ref 0–35)
AST: 12 U/L (ref 0–37)
Albumin: 4.1 g/dL (ref 3.5–5.2)
Alkaline Phosphatase: 61 U/L (ref 39–117)
BUN: 10 mg/dL (ref 6–23)
CO2: 27 mEq/L (ref 19–32)
Calcium: 9.3 mg/dL (ref 8.4–10.5)
Chloride: 103 mEq/L (ref 96–112)
Creatinine, Ser: 0.69 mg/dL (ref 0.40–1.20)
GFR: 108.99 mL/min (ref >60.00)
Glucose, Bld: 86 mg/dL (ref 70–99)
Potassium: 4.2 mEq/L (ref 3.5–5.1)
Sodium: 135 mEq/L (ref 135–145)
Total Bilirubin: 0.5 mg/dL (ref 0.2–1.2)
Total Protein: 6.9 g/dL (ref 6.0–8.3)

## 2021-01-25 LAB — TSH: TSH: 4.09 u[IU]/mL (ref 0.35–4.50)

## 2021-01-25 LAB — VITAMIN D 25 HYDROXY (VIT D DEFICIENCY, FRACTURES): VITD: 29.49 ng/mL — ABNORMAL LOW (ref 30.00–100.00)

## 2021-01-26 LAB — URINALYSIS, ROUTINE W REFLEX MICROSCOPIC
Bilirubin Urine: NEGATIVE
Glucose, UA: NEGATIVE
Hgb urine dipstick: NEGATIVE
Ketones, ur: NEGATIVE
Leukocytes,Ua: NEGATIVE
Nitrite: NEGATIVE
Protein, ur: NEGATIVE
Specific Gravity, Urine: 1.002 (ref 1.001–1.03)
pH: 7 (ref 5.0–8.0)

## 2021-01-29 ENCOUNTER — Other Ambulatory Visit: Payer: Self-pay | Admitting: Internal Medicine

## 2021-01-29 DIAGNOSIS — E559 Vitamin D deficiency, unspecified: Secondary | ICD-10-CM

## 2021-01-29 MED ORDER — CHOLECALCIFEROL 1.25 MG (50000 UT) PO CAPS: 50000.0000 [IU] | ORAL_CAPSULE | ORAL | 3 refills | Status: DC

## 2021-01-30 ENCOUNTER — Other Ambulatory Visit: Payer: Self-pay | Admitting: Internal Medicine

## 2021-01-30 DIAGNOSIS — F419 Anxiety disorder, unspecified: Secondary | ICD-10-CM

## 2021-03-20 ENCOUNTER — Ambulatory Visit
Admission: RE | Admit: 2021-03-20 | Discharge: 2021-03-20 | Disposition: A | Discharge status: Home / Self Care | Payer: BC Managed Care – PPO | Source: Ambulatory Visit | Attending: Internal Medicine | Admitting: Internal Medicine

## 2021-03-20 ENCOUNTER — Other Ambulatory Visit: Payer: Self-pay

## 2021-03-20 DIAGNOSIS — Z1231 Encounter for screening mammogram for malignant neoplasm of breast: Secondary | ICD-10-CM | POA: Diagnosis not present

## 2021-04-23 ENCOUNTER — Other Ambulatory Visit: Payer: Self-pay | Admitting: Internal Medicine

## 2021-04-23 DIAGNOSIS — F419 Anxiety disorder, unspecified: Secondary | ICD-10-CM

## 2021-04-23 MED ORDER — CLONAZEPAM 0.5 MG PO TABS: 0.5000 mg | ORAL_TABLET | Freq: Every evening | ORAL | 2 refills | Status: DC | PRN

## 2021-04-23 NOTE — Telephone Encounter (Signed)
RX Refill:klonopin Last Seen:06-14-20 Last ordered:06-14-20

## 2021-06-14 ENCOUNTER — Encounter: Payer: 59 | Admitting: Internal Medicine

## 2021-07-06 ENCOUNTER — Other Ambulatory Visit: Payer: Self-pay | Admitting: Internal Medicine

## 2021-07-06 ENCOUNTER — Telehealth: Payer: Self-pay | Admitting: Internal Medicine

## 2021-07-06 DIAGNOSIS — F419 Anxiety disorder, unspecified: Secondary | ICD-10-CM

## 2021-07-06 DIAGNOSIS — G4700 Insomnia, unspecified: Secondary | ICD-10-CM

## 2021-07-06 MED ORDER — CLONAZEPAM 0.5 MG PO TABS: 0.5000 mg | ORAL_TABLET | Freq: Every evening | ORAL | 5 refills | Status: DC | PRN

## 2021-07-06 MED ORDER — ZOLPIDEM TARTRATE 5 MG PO TABS: 5.0000 mg | ORAL_TABLET | Freq: Every day | ORAL | 5 refills | Status: DC

## 2021-07-06 NOTE — Telephone Encounter (Signed)
Patient is requesting a refill on her zolpidem (AMBIEN) 5 MG tablet.

## 2021-07-19 ENCOUNTER — Encounter: Payer: Self-pay | Admitting: Internal Medicine

## 2021-07-19 ENCOUNTER — Other Ambulatory Visit: Payer: Self-pay

## 2021-07-19 ENCOUNTER — Ambulatory Visit (INDEPENDENT_AMBULATORY_CARE_PROVIDER_SITE_OTHER): Payer: BC Managed Care – PPO | Admitting: Internal Medicine

## 2021-07-19 VITALS — BP 110/80 | HR 80 | Temp 96.6°F | Ht 70.08 in | Wt 209.8 lb

## 2021-07-19 DIAGNOSIS — L989 Disorder of the skin and subcutaneous tissue, unspecified: Secondary | ICD-10-CM

## 2021-07-19 DIAGNOSIS — Z1329 Encounter for screening for other suspected endocrine disorder: Secondary | ICD-10-CM | POA: Diagnosis not present

## 2021-07-19 DIAGNOSIS — E611 Iron deficiency: Secondary | ICD-10-CM

## 2021-07-19 DIAGNOSIS — E559 Vitamin D deficiency, unspecified: Secondary | ICD-10-CM

## 2021-07-19 DIAGNOSIS — D229 Melanocytic nevi, unspecified: Secondary | ICD-10-CM

## 2021-07-19 DIAGNOSIS — Z1389 Encounter for screening for other disorder: Secondary | ICD-10-CM

## 2021-07-19 DIAGNOSIS — Z0000 Encounter for general adult medical examination without abnormal findings: Secondary | ICD-10-CM

## 2021-07-19 DIAGNOSIS — Z1283 Encounter for screening for malignant neoplasm of skin: Secondary | ICD-10-CM

## 2021-07-19 DIAGNOSIS — N938 Other specified abnormal uterine and vaginal bleeding: Secondary | ICD-10-CM

## 2021-07-19 NOTE — Progress Notes (Signed)
Chief Complaint  Patient presents with   Annual Exam   Annual  1. Anxiety controlled on klonopin 0.5 qd prn, atarax 50 mg prn, ambien 5 mg qhs for chronic insomni a 2. DUB and spotting irreg x 1 month cycle lasted 3 days prior then off then 3 days of spotting and acne new and breast tenderness, increased sweating and mood swings will refer ob/gyn hormone testing  3. Left chest lesion irritated been there for years but sore refer dermatology    Review of Systems  Constitutional:  Negative for weight loss.  HENT:  Negative for hearing loss.   Eyes:  Negative for blurred vision.  Respiratory:  Negative for shortness of breath.   Cardiovascular:  Negative for chest pain.  Gastrointestinal:  Negative for abdominal pain.  Musculoskeletal:  Negative for falls and joint pain.  Skin:  Negative for rash.  Psychiatric/Behavioral:  The patient is not nervous/anxious and does not have insomnia.   Past Medical History:  Diagnosis Date   Anemia    Chronic insomnia    IUD complication ([place])    spontaneously came out while on honeymoon   Migraine    Preeclampsia    Sinus infection    Tourette syndrome    Urinary tract infection    Past Surgical History:  Procedure Laterality Date   WISDOM TOOTH EXTRACTION     Family History  Problem Relation Age of Onset   Depression Mother    Anxiety disorder Mother    Heart disease Father    Hypertension Father    Diabetes Father    Tourette syndrome Father    Lymphoma Father        ? pre CLL vs other blood cancer 77/78   Breast cancer Neg Hx    Social History   Socioeconomic History   Marital status: Married    Spouse name: Not on file   Number of children: Not on file   Years of education: Not on file   Highest education level: Not on file  Occupational History   Not on file  Tobacco Use   Smoking status: Former   Smokeless tobacco: Never   Tobacco comments:    Education officer, community Use   Vaping Use: Never used  Substance and Sexual  Activity   Alcohol use: No   Drug use: Not on file   Sexual activity: Not on file  Other Topics Concern   Not on file  Social History Narrative   Lives in [place].      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   Cholecalciferol 1.25 MG (50000 UT) capsule Take 1 capsule (50,000 Units total) by mouth once a week.   clonazePAM (KLONOPIN) 0.5 MG tablet Take 1 tablet (0.5 mg total) by mouth at bedtime as needed for anxiety.   ferrous sulfate 325 (65 FE) MG tablet Take by mouth daily.   hydrOXYzine (  ATARAX/VISTARIL) 50 MG tablet TAKE 1 TABLET(50 MG) BY MOUTH THREE TIMES DAILY AS NEEDED   vitamin B-12 (CYANOCOBALAMIN) 1000 MCG tablet Take 1,000 mcg by mouth daily.   zolpidem (AMBIEN) 5 MG tablet Take 1 tablet (5 mg total) by mouth at bedtime. prn   Allergies  Allergen Reactions   Abilify [Aripiprazole]     Hallucinations     Augmentin [Amoxicillin-Pot Clavulanate] Nausea Only   No results found for this or any previous visit (from the past 2160 hour(s)). Objective  Body mass index is 30.04 kg/m. Wt Readings from Last 3 Encounters:  07/19/21 209 lb 12.8 oz (95.2 kg)  06/14/20 200 lb (90.7 kg)  03/07/20 185 lb (83.9 kg)   Temp Readings from Last 3 Encounters:  07/19/21 (!) 96.6 F (35.9 C) (Temporal)  06/14/20 98.2 F (36.8 C) (Oral)  09/03/19 98.2 F (36.8 C) (Oral)   BP Readings from Last 3 Encounters:  07/19/21 110/80  06/14/20 122/80  09/03/19 120/76   Pulse Readings from Last 3 Encounters:  07/19/21 80   06/14/20 77  09/03/19 78    Physical Exam Vitals and nursing note reviewed.  Constitutional:      Appearance: Normal appearance. She is well-developed and well-groomed.  HENT:     Head: Normocephalic and atraumatic.  Eyes:     Conjunctiva/sclera: Conjunctivae normal.     Pupils: Pupils are equal, round, and reactive to light.  Cardiovascular:     Rate and Rhythm: Normal rate and regular rhythm.     Heart sounds: Normal heart sounds. No murmur heard. Pulmonary:     Effort: Pulmonary effort is normal.     Breath sounds: Normal breath sounds.  Chest:     Chest wall: No mass.  Breasts:    Breasts are symmetrical.     Right: Normal.     Left: Normal.  Abdominal:     Tenderness: There is no abdominal tenderness.  Lymphadenopathy:     Upper Body:     Right upper body: No axillary adenopathy.     Left upper body: No axillary adenopathy.  Skin:    General: Skin is warm and dry.  Neurological:     General: No focal deficit present.     Mental Status: She is alert and oriented to person, place, and time. Mental status is at baseline.     Gait: Gait normal.  Psychiatric:        Attention and Perception: Attention and perception normal.        Mood and Affect: Mood and affect normal.        Speech: Speech normal.        Behavior: Behavior normal. Behavior is cooperative.        Thought Content: Thought content normal.        Cognition and Memory: Cognition and memory normal.        Judgment: Judgment normal.    Assessment  Plan  Annual physical exam - Fasting labs 01/25/21  Flu shot had 01/12/20 rec upcoming No vaccine covid 19 declines but thinkging about it Tdap 06/15/17   Mammogram ordered 03/2021 negative   Pap Dr. Garwin Brothers h/o abnormal and HPV in 2012  -->Pap 02/02/19 neg HPV neg pap   Colonoscopy age 2 rec.   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 Linda Hedges [facility]   Provider: Dr. Olivia Mackie McLean-Scocuzza-Internal Medicine

## 2021-09-25 DIAGNOSIS — N921 Excessive and frequent menstruation with irregular cycle: Secondary | ICD-10-CM | POA: Diagnosis not present

## 2021-09-25 DIAGNOSIS — R5383 Other fatigue: Secondary | ICD-10-CM | POA: Diagnosis not present

## 2021-09-25 DIAGNOSIS — N6459 Other signs and symptoms in breast: Secondary | ICD-10-CM | POA: Diagnosis not present

## 2022-01-01 ENCOUNTER — Other Ambulatory Visit: Payer: Self-pay | Admitting: Internal Medicine

## 2022-01-01 DIAGNOSIS — G4700 Insomnia, unspecified: Secondary | ICD-10-CM

## 2022-01-31 ENCOUNTER — Other Ambulatory Visit: Payer: Self-pay | Admitting: Internal Medicine

## 2022-01-31 DIAGNOSIS — F419 Anxiety disorder, unspecified: Secondary | ICD-10-CM

## 2022-02-04 ENCOUNTER — Other Ambulatory Visit: Payer: Self-pay | Admitting: Internal Medicine

## 2022-02-04 ENCOUNTER — Ambulatory Visit: Payer: BC Managed Care – PPO | Admitting: Dermatology

## 2022-02-04 DIAGNOSIS — F419 Anxiety disorder, unspecified: Secondary | ICD-10-CM

## 2022-02-04 DIAGNOSIS — E559 Vitamin D deficiency, unspecified: Secondary | ICD-10-CM

## 2022-02-18 ENCOUNTER — Other Ambulatory Visit: Payer: Self-pay

## 2022-02-18 ENCOUNTER — Ambulatory Visit (INDEPENDENT_AMBULATORY_CARE_PROVIDER_SITE_OTHER): Payer: 59 | Admitting: Dermatology

## 2022-02-18 DIAGNOSIS — L905 Scar conditions and fibrosis of skin: Secondary | ICD-10-CM

## 2022-02-18 DIAGNOSIS — Z1283 Encounter for screening for malignant neoplasm of skin: Secondary | ICD-10-CM | POA: Diagnosis not present

## 2022-02-18 DIAGNOSIS — D229 Melanocytic nevi, unspecified: Secondary | ICD-10-CM

## 2022-02-18 DIAGNOSIS — D225 Melanocytic nevi of trunk: Secondary | ICD-10-CM | POA: Diagnosis not present

## 2022-02-18 DIAGNOSIS — D489 Neoplasm of uncertain behavior, unspecified: Secondary | ICD-10-CM

## 2022-02-18 DIAGNOSIS — L578 Other skin changes due to chronic exposure to nonionizing radiation: Secondary | ICD-10-CM | POA: Diagnosis not present

## 2022-02-18 NOTE — Patient Instructions (Addendum)
Discussed resulting small scar with shave removal, and possible recurrence of lesion.  Recommend vaseline ointment to area daily and cover until healed.  Recommend photoprotection/sunscreen to area to prevent discoloration of scar.  Once healed, may apply OTC Serica scar gel bid to thickened scars.      Biopsy Wound Care Instructions  Leave the original bandage on for 24 hours if possible.  If the bandage becomes soaked or soiled before that time, it is OK to remove it and examine the wound.  A small amount of post-operative bleeding is normal.  If excessive bleeding occurs, remove the bandage, place gauze over the site and apply continuous pressure (no peeking) over the area for 30 minutes. If this does not work, please call our clinic as soon as possible or page your doctor if it is after hours.   Once a day, cleanse the wound with soap and water. It is fine to shower. If a thick crust develops you may use a Q-tip dipped into dilute hydrogen peroxide (mix 1:1 with water) to dissolve it.  Hydrogen peroxide can slow the healing process, so use it only as needed.    After washing, apply petroleum jelly (Vaseline) or an antibiotic ointment if your doctor prescribed one for you, followed by a bandage.    For best healing, the wound should be covered with a layer of ointment at all times. If you are not able to keep the area covered with a bandage to hold the ointment in place, this may mean re-applying the ointment several times a day.  Continue this wound care until the wound has healed and is no longer open.   Itching and mild discomfort is normal during the healing process. However, if you develop pain or severe itching, please call our office.   If you have any discomfort, you can take Tylenol (acetaminophen) or ibuprofen as directed on the bottle. (Please do not take these if you have an allergy to them or cannot take them for another reason).  Some redness, tenderness and white or  yellow material in the wound is normal healing.  If the area becomes very sore and red, or develops a thick yellow-green material (pus), it may be infected; please notify us.    If you have stitches, return to clinic as directed to have the stitches removed. You will continue wound care for 2-3 days after the stitches are removed.   Wound healing continues for up to one year following surgery. It is not unusual to experience pain in the scar from time to time during the interval.  If the pain becomes severe or the scar thickens, you should notify the office.    A slight amount of redness in a scar is expected for the first six months.  After six months, the redness will fade and the scar will soften and fade.  The color difference becomes less noticeable with time.  If there are any problems, return for a post-op surgery check at your earliest convenience.  To improve the appearance of the scar, you can use silicone scar gel, cream, or sheets (such as Mederma or Serica) every night for up to one year. These are available over the counter (without a prescription).  Please call our office at [phone] for any questions or concerns.  Melanoma ABCDEs  Melanoma is the most dangerous type of skin cancer, and is the leading cause of death from skin disease.  You are more likely to develop melanoma  if you: Have light-colored skin, light-colored eyes, or red or blond hair Spend a lot of time in the sun Tan regularly, either outdoors or in a tanning bed Have had blistering sunburns, especially during childhood Have a close family member who has had a melanoma Have atypical moles or large birthmarks  Early detection of melanoma is key since treatment is typically straightforward and cure rates are extremely high if we catch it early.   The first sign of melanoma is often a change in a mole or a new dark spot.  The ABCDE system is a way of remembering the signs of melanoma.  A for asymmetry:  The  two halves do not match. B for border:  The edges of the growth are irregular. C for color:  A mixture of colors are present instead of an even brown color. D for diameter:  Melanomas are usually (but not always) greater than 77m - the size of a pencil eraser. E for evolution:  The spot keeps changing in size, shape, and color.  Please check your skin once per month between visits. You can use a small mirror in front and a large mirror behind you to keep an eye on the back side or your body.   If you see any new or changing lesions before your next follow-up, please call to schedule a visit.  Please continue daily skin protection including broad spectrum sunscreen SPF 30+ to sun-exposed areas, reapplying every 2 hours as needed when you're outdoors.   Staying in the shade or wearing long sleeves, sun glasses (UVA+UVB protection) and wide brim hats (4-inch brim around the entire circumference of the hat) are also recommended for sun protection.    If You Need Anything After Your Visit  If you have any questions or concerns for your doctor, please call our main line at [phone] press option 4 to reach your doctor's medical assistant. If no one answers, please leave a voicemail as directed and we will return your call as soon as possible. Messages left after 4 pm will be answered the following business day.   You may also send [place] message via [place] We typically respond to MyChart messages within 1-2 business days.  For prescription refills, please ask your pharmacy to contact our office. Our fax number is [phone]  If you have an urgent issue when the clinic is closed that cannot wait until the next business day, you can page your doctor at the number below.    Please note that while we do our best to be available for urgent issues outside of office hours, we are not available 24/7.   If you have an urgent issue and are unable to reach [place] you may choose to seek medical care  at your doctor's office, retail clinic, urgent care center, or emergency room.  If you have a medical emergency, please immediately call 911 or go to the emergency department.  Pager Numbers  - Dr. KNehemiah Massed [phone] - Dr. MLaurence Ferrari [phone] - Dr. SNicole Kindred [phone] In the event of inclement weather, please call our main line at [phone] an update on the status of any delays or closures.  Dermatology Medication Tips: Please keep the boxes that topical medications come in in order to help keep track of the instructions about where and how to use these. Pharmacies typically print the medication instructions only on the boxes and not directly on the medication tubes.   If your medication is  too expensive, please contact our office at [phone] option 4 or send [place] a message through [place].   We are unable to tell what your co-pay for medications will be in advance as this is different depending on your insurance coverage. However, we may be able to find a substitute medication at lower cost or fill out paperwork to get insurance to cover a needed medication.   If a prior authorization is required to get your medication covered by your insurance company, please allow [place] 1-2 business days to complete this process.  Drug prices often vary depending on where the prescription is filled and some pharmacies may offer cheaper prices.  The website www.goodrx.com contains coupons for medications through different pharmacies. The prices here do not account for what the cost may be with help from insurance (it may be cheaper with your insurance), but the website can give you the price if you did not use any insurance.  - You can print the associated coupon and take it with your prescription to the pharmacy.  - You may also stop by our office during regular business hours and pick up a GoodRx coupon card.  - If you need your prescription sent electronically to a different pharmacy,  notify our office through [facility] or by phone at [phone] option 4.     Si Usted Necesita Algo Despus de Su Visita  Tambin puede enviarnos un mensaje a travs de Pharmacist, community. Por lo general respondemos a los mensajes de MyChart en el transcurso de 1 a 2 das hbiles.  Para renovar recetas, por favor pida a su farmacia que se ponga en contacto con nuestra oficina. Harland Dingwall de fax es [place] [phone].  Si tiene un asunto urgente cuando la clnica est cerrada y que no puede esperar hasta el siguiente da hbil, puede llamar/localizar a su doctor(a) al nmero que aparece a continuacin.   Por favor, tenga en cuenta que aunque hacemos todo lo posible para estar disponibles para asuntos urgentes fuera del horario de [place], no estamos disponibles las 24 horas del da, los 7 das de la [place].   Si tiene un problema urgente y no puede comunicarse con nosotros, puede optar por buscar atencin mdica  en el consultorio de su doctor(a), en una clnica privada, en un centro de atencin urgente o en una sala de emergencias.  Si tiene Engineering geologist, por favor llame inmediatamente al 911 o vaya a la sala de emergencias.  Nmeros de bper  - Dr. Nehemiah Massed: [phone]  - Dra. Moye: [phone]  - Dra. Nicole Kindred: [phone]  En caso de inclemencias del [place], por favor llame a Johnsie Kindred principal al [phone] para una actualizacin sobre el [place] de cualquier retraso o cierre.  Consejos para la medicacin en dermatologa: Por favor, guarde las cajas en las que vienen los medicamentos de uso tpico para ayudarle a seguir las instrucciones sobre dnde y cmo usarlos. Las farmacias generalmente imprimen las instrucciones del medicamento slo en las cajas y no directamente en los tubos del [place].   Si su medicamento es muy caro, por favor, pngase en contacto con Zigmund Daniel llamando al [phone] y presione la opcin 4 o envenos un mensaje a travs de  Pharmacist, community.   No podemos decirle cul ser su copago por los medicamentos por adelantado ya que esto es diferente dependiendo de la cobertura de su seguro. Sin embargo, es posible que podamos encontrar un medicamento sustituto a Electrical engineer un formulario para que el  seguro cubra el medicamento que se considera necesario.   Si se requiere una autorizacin previa para que su compaa de seguros [place] su medicamento, por favor permtanos de 1 a 2 das hbiles para completar este proceso.  Los precios de los medicamentos varan con frecuencia dependiendo del Environmental consultant de dnde se surte la receta y alguna farmacias pueden ofrecer precios ms baratos.  El sitio web www.goodrx.com tiene cupones para medicamentos de Airline pilot. Los precios aqu no tienen en cuenta lo que podra costar con la ayuda del seguro (puede ser ms barato con su seguro), pero el sitio web puede darle el precio si no utiliz Research scientist (physical sciences).  - Puede imprimir el cupn correspondiente y llevarlo con su receta a la farmacia.  - Tambin puede pasar por nuestra oficina durante el horario de atencin regular y Charity fundraiser una tarjeta de cupones de GoodRx.  - Si necesita que su receta se enve electrnicamente a una farmacia diferente, informe a nuestra oficina a travs de MyChart de [place] o por telfono llamando al [phone] y presione la opcin 4.

## 2022-02-18 NOTE — Progress Notes (Signed)
? ?New Patient Visit ? ?Subjective  ?Kelsey Mcguire is a 41 y.o. female who presents for the following: New Patient (Initial Visit) (Patient here today for new patient and tbse. Patient denies family or personal history of skin cancer. Patient reports a mole at chest sometimes get irritated and mole at back sometimes sore she is concerning about. ). ? ?The patient presents for Total-Body Skin Exam (TBSE) for skin cancer screening and mole check.  The patient has spots, moles and lesions to be evaluated, some may be new or changing and the patient has concerns that these could be cancer. ? ? ? ?The following portions of the chart were reviewed this encounter and updated as appropriate:  ?  ?  ? ?Review of Systems:  No other skin or systemic complaints except as noted in HPI or Assessment and Plan. ? ?Objective  ?Well appearing patient in no apparent distress; mood and affect are within normal limits. ? ?A full examination was performed including scalp, head, eyes, ears, nose, lips, neck, chest, axillae, abdomen, back, buttocks, bilateral upper extremities, bilateral lower extremities, hands, feet, fingers, toes, fingernails, and toenails. All findings within normal limits unless otherwise noted below. ? ?left medial upper breast ?8 mm fleshy tan papule  ? ? ? ? ? ? ?left spinal lower back ?6 mm flesh tan papule  ? ? ? ? ? ? ?right upper buttocks ?5 mm tan macule  ? ?Left Forearm - Posterior ?Linear smooth white plaque ? ? ? ?Assessment & Plan  ?Neoplasm of uncertain behavior (2) ?left medial upper breast ? ?Epidermal / dermal shaving ? ?Lesion diameter (cm):  0.8 ?Informed consent: discussed and consent obtained   ?Patient was prepped and draped in usual sterile fashion: Area prepped with alcohol. ?Anesthesia: the lesion was anesthetized in a standard fashion   ?Anesthetic:  1% lidocaine w/ epinephrine 1-100,000 buffered w/ 8.4% NaHCO3 ?Instrument used: flexible razor blade   ?Hemostasis achieved with:  pressure, aluminum chloride and electrodesiccation   ?Outcome: patient tolerated procedure well   ?Post-procedure details: wound care instructions given   ?Post-procedure details comment:  Ointment and small bandage applied.  ? ?Specimen 1 - Surgical pathology ?Differential Diagnosis: irritated nevus vs other  ? ?Check Margins: No ? ?left spinal lower back ? ?Epidermal / dermal shaving ? ?Lesion diameter (cm):  0.6 ?Informed consent: discussed and consent obtained   ?Patient was prepped and draped in usual sterile fashion: Area prepped with alcohol. ?Anesthesia: the lesion was anesthetized in a standard fashion   ?Anesthetic:  1% lidocaine w/ epinephrine 1-100,000 buffered w/ 8.4% NaHCO3 ?Instrument used: flexible razor blade   ?Hemostasis achieved with: pressure, aluminum chloride and electrodesiccation   ?Outcome: patient tolerated procedure well   ?Post-procedure details: wound care instructions given   ?Post-procedure details comment:  Ointment and small bandage applied.  ? ?Specimen 2 - Surgical pathology ?Differential Diagnosis: irritated nevus vs other  ? ?Check Margins: No ? ?Irritated nevus vs other  ? ?Discussed resulting small scar with shave removal, and possible recurrence of lesion.  Recommend vaseline ointment to area daily and cover until healed.  Recommend photoprotection/sunscreen to area to prevent discoloration of scar.  Once healed, may apply OTC Serica scar gel bid to thickened scars.  ? ?Nevus ?right upper buttocks ? ?Benign-appearing.  Observation.  Call clinic for new or changing lesions.  Recommend daily use of broad spectrum spf 30+ sunscreen to sun-exposed areas.  ? ? ?Scar ?Left Forearm - Posterior ? ?Benign, observe.  H/o trauma in  past ? ?Lentigines ?- Scattered tan macules ?- Due to sun exposure ?- Benign-appearing, observe ?- Recommend daily broad spectrum sunscreen SPF 30+ to sun-exposed areas, reapply every 2 hours as needed. ?- Call for any changes ? ?Seborrheic Keratoses ?-  Stuck-on, waxy, tan-brown papules and/or plaques  ?- Benign-appearing ?- Discussed benign etiology and prognosis. ?- Observe ?- Call for any changes ? ?Melanocytic Nevi ?- Tan-brown and/or pink-flesh-colored symmetric macules and papules ?- Benign appearing on exam today ?- Observation ?- Call clinic for new or changing moles ?- Recommend daily use of broad spectrum spf 30+ sunscreen to sun-exposed areas.  ? ?Hemangiomas ?- Red papules ?- Discussed benign nature ?- Observe ?- Call for any changes ? ?Actinic Damage ?- Chronic condition, secondary to cumulative UV/sun exposure ?- diffuse scaly erythematous macules with underlying dyspigmentation ?- Recommend daily broad spectrum sunscreen SPF 30+ to sun-exposed areas, reapply every 2 hours as needed.  ?- Staying in the shade or wearing long sleeves, sun glasses (UVA+UVB protection) and wide brim hats (4-inch brim around the entire circumference of the hat) are also recommended for sun protection.  ?- Call for new or changing lesions. ? ?Skin cancer screening performed today. ?Return if symptoms worsen or fail to improve. ? ?I, Ruthell Rummage, CMA, am acting as scribe for Brendolyn Patty, MD. ? ?Documentation: I have reviewed the above documentation for accuracy and completeness, and I agree with the above. ? ?Brendolyn Patty MD  ? ? ?

## 2022-02-20 ENCOUNTER — Telehealth: Payer: Self-pay

## 2022-02-20 NOTE — Telephone Encounter (Signed)
Left pt message advising bx results benign./sh ?

## 2022-02-20 NOTE — Telephone Encounter (Signed)
-----   Message from Brendolyn Patty, MD sent at 02/20/2022  1:39 PM EDT ----- ?1. Skin , left medial upper breast ?MELANOCYTIC NEVUS, INTRADERMAL TYPE, BASE INVOLVED ?2. Skin , left spinal lower back ?MELANOCYTIC NEVUS, INTRADERMAL TYPE, BASE INVOLVED ? ?1 and 2. Benign moles - please call patient ?

## 2022-04-01 ENCOUNTER — Other Ambulatory Visit: Payer: Self-pay | Admitting: Internal Medicine

## 2022-04-01 DIAGNOSIS — G4700 Insomnia, unspecified: Secondary | ICD-10-CM

## 2022-04-08 ENCOUNTER — Other Ambulatory Visit: Payer: Self-pay | Admitting: Internal Medicine

## 2022-04-08 ENCOUNTER — Telehealth: Payer: Self-pay | Admitting: Internal Medicine

## 2022-04-08 DIAGNOSIS — Z1231 Encounter for screening mammogram for malignant neoplasm of breast: Secondary | ICD-10-CM

## 2022-04-08 NOTE — Telephone Encounter (Signed)
Lvm to inform pt that she is not due for labs until her next physical in august, she is welcome to come in for labs 1 wk prior to her appt so that results can be discussed in office during her physicals labs have been ordered. Pt can f/u as needed.  ?

## 2022-04-08 NOTE — Telephone Encounter (Signed)
Pt want to know if she is due for lab work or should she just wait until she gets her physical in august ?

## 2022-05-22 ENCOUNTER — Ambulatory Visit
Admission: RE | Admit: 2022-05-22 | Discharge: 2022-05-22 | Disposition: A | Discharge status: Home / Self Care | Payer: 59 | Source: Ambulatory Visit | Attending: Internal Medicine | Admitting: Internal Medicine

## 2022-05-22 DIAGNOSIS — Z1231 Encounter for screening mammogram for malignant neoplasm of breast: Secondary | ICD-10-CM | POA: Diagnosis present

## 2022-06-05 ENCOUNTER — Encounter: Payer: Self-pay | Admitting: Family

## 2022-06-05 ENCOUNTER — Ambulatory Visit (INDEPENDENT_AMBULATORY_CARE_PROVIDER_SITE_OTHER): Payer: 59 | Admitting: Family

## 2022-06-05 VITALS — BP 112/72 | HR 89 | Temp 98.1°F | Ht 70.0 in | Wt 183.2 lb

## 2022-06-05 DIAGNOSIS — H6501 Acute serous otitis media, right ear: Secondary | ICD-10-CM | POA: Diagnosis not present

## 2022-06-05 DIAGNOSIS — H6691 Otitis media, unspecified, right ear: Secondary | ICD-10-CM | POA: Insufficient documentation

## 2022-06-05 DIAGNOSIS — J029 Acute pharyngitis, unspecified: Secondary | ICD-10-CM

## 2022-06-05 HISTORY — DX: Otitis media, unspecified, right ear: H66.91

## 2022-06-05 LAB — POCT RAPID STREP A (OFFICE): Rapid Strep A Screen: NEGATIVE

## 2022-06-05 LAB — POC COVID19 BINAXNOW: SARS Coronavirus 2 Ag: NEGATIVE

## 2022-06-05 MED ORDER — CEFDINIR 300 MG PO CAPS: 300.0000 mg | ORAL_CAPSULE | Freq: Two times a day (BID) | ORAL | 0 refills | Status: AC

## 2022-06-05 NOTE — Assessment & Plan Note (Signed)
Negative step, covid.  Afebrile.  Patient previously had nausea and vomiting on Augmentin.  We jointly agreed we would use second line cephalosporin to avoid side effects.  I have started cefdinir 300 mg twice daily x10 days.  Counseled on the importance of probiotics.  Advised her via mychart to use Afrin and Sudafed prior to getting on an airplane in 2 days.  She will let me know how she is doing.

## 2022-06-05 NOTE — Progress Notes (Signed)
Subjective:    Patient ID: Kelsey Mcguire, female    DOB: 03-13-1981, 41 y.o.   MRN: [id number]  CC: Kelsey Mcguire is a 41 y.o. female who presents today for an acute visit.    HPI: Complains of right ear pain x 7 days, unchanged.  Endorses enlarged gland on right side.  Cough has resolved from last week. No fever,sob, wheezing, discharge from ear Traveling by plane in 2 days.  She has been taking sudafed with some relief temporarily.  She has seasonal allergies. She is not on antihistamine.      HISTORY:  Past Medical History:  Diagnosis Date   Anemia    Chronic insomnia    IUD complication ([place])    spontaneously came out while on honeymoon   Migraine    Preeclampsia    Sinus infection    Tourette syndrome    Urinary tract infection    Past Surgical History:  Procedure Laterality Date   WISDOM TOOTH EXTRACTION     Family History  Problem Relation Age of Onset   Depression Mother    Anxiety disorder Mother    Heart disease Father    Hypertension Father    Diabetes Father    Tourette syndrome Father    Lymphoma Father        ? pre CLL vs other blood cancer 77/78   Breast cancer Neg Hx     Allergies: Abilify [aripiprazole] and Augmentin [amoxicillin-pot clavulanate] Current Outpatient Medications on File Prior to Visit  Medication Sig Dispense Refill   Ascorbic Acid (VITAMIN C) 1000 MG tablet Take 1,000 mg by mouth daily.     Cholecalciferol (VITAMIN D3) 1.25 MG (50000 UT) CAPS TAKE 1 CAPSULE BY MOUTH 1 TIME A WEEK 13 capsule 3   clonazePAM (KLONOPIN) 0.5 MG tablet TAKE 1 TABLET(0.5 MG) BY MOUTH AT BEDTIME AS NEEDED FOR ANXIETY 30 tablet 2   ferrous sulfate 325 (65 FE) MG tablet Take by mouth daily.     hydrOXYzine (ATARAX) 50 MG tablet TAKE 1 TABLET(50 MG) BY MOUTH THREE TIMES DAILY AS NEEDED 90 tablet 11   vitamin B-12 (CYANOCOBALAMIN) 1000 MCG tablet Take 1,000 mcg by mouth daily.     zolpidem (AMBIEN) 5 MG tablet TAKE 1 TABLET(5 MG) BY MOUTH AT  BEDTIME AS NEEDED 30 tablet 2   No current facility-administered medications on file prior to visit.    Social History   Tobacco Use   Smoking status: Former   Smokeless tobacco: Never   Tobacco comments:    Engineer, materials Use: Never used  Substance Use Topics   Alcohol use: No    Review of Systems  Constitutional:  Negative for chills and fever.  HENT:  Positive for ear pain. Negative for congestion, ear discharge and facial swelling.   Respiratory:  Negative for cough.   Cardiovascular:  Negative for chest pain and palpitations.  Gastrointestinal:  Negative for nausea and vomiting.      Objective:    BP 112/72   Pulse 89   Temp 98.1 F (36.7 C) (Oral)   Ht '5\' 10"'$  (1.778 m)   Wt 183 lb 4 oz (83.1 kg)   SpO2 99%   BMI 26.29 kg/m    Physical Exam Vitals reviewed.  Constitutional:      Appearance: She is well-developed.  HENT:     Head: Normocephalic and atraumatic.     Right Ear: Hearing, ear canal and external ear normal. No decreased hearing  noted. No drainage, swelling or tenderness. A middle ear effusion is present. No foreign body. Tympanic membrane is injected and erythematous. Tympanic membrane is not perforated or bulging.     Left Ear: Hearing, tympanic membrane, ear canal and external ear normal. No decreased hearing noted. No drainage, swelling or tenderness.  No middle ear effusion. No foreign body. Tympanic membrane is not erythematous or bulging.     Nose: Nose normal. No rhinorrhea.     Right Sinus: No maxillary sinus tenderness or frontal sinus tenderness.     Left Sinus: No maxillary sinus tenderness or frontal sinus tenderness.     Mouth/Throat:     Pharynx: Uvula midline. No oropharyngeal exudate or posterior oropharyngeal erythema.     Tonsils: No tonsillar abscesses.  Eyes:     Conjunctiva/sclera: Conjunctivae normal.  Cardiovascular:     Rate and Rhythm: Regular rhythm.     Pulses: Normal pulses.     Heart sounds: Normal  heart sounds.  Pulmonary:     Effort: Pulmonary effort is normal.     Breath sounds: Normal breath sounds. No wheezing, rhonchi or rales.  Lymphadenopathy:     Head:     Right side of head: No submental, submandibular, tonsillar, preauricular, posterior auricular or occipital adenopathy.     Left side of head: No submental, submandibular, tonsillar, preauricular, posterior auricular or occipital adenopathy.     Cervical: No cervical adenopathy.  Skin:    General: Skin is warm and dry.  Neurological:     Mental Status: She is alert.  Psychiatric:        Speech: Speech normal.        Behavior: Behavior normal.        Thought Content: Thought content normal.        Assessment & Plan:   Problem List Items Addressed This Visit       Nervous and Auditory   Right otitis media    Negative step, covid.  Afebrile.  Patient previously had nausea and vomiting on Augmentin.  We jointly agreed we would use second line cephalosporin to avoid side effects.  I have started cefdinir 300 mg twice daily x10 days.  Counseled on the importance of probiotics.  Advised her via mychart to use Afrin and Sudafed prior to getting on an airplane in 2 days.  She will let me know how she is doing.      Relevant Medications   cefdinir (OMNICEF) 300 MG capsule   Other Visit Diagnoses     Sore throat    -  Primary   Relevant Medications   cefdinir (OMNICEF) 300 MG capsule   Other Relevant Orders   POCT rapid strep A   POC COVID-19 (Completed)          I am having Kelsey Mcguire start on cefdinir. I am also having her maintain her ferrous sulfate, vitamin B-12, vitamin C, clonazePAM, Vitamin D3, hydrOXYzine, and zolpidem.   Meds ordered this encounter  Medications   cefdinir (OMNICEF) 300 MG capsule    Sig: Take 1 capsule (300 mg total) by mouth 2 (two) times daily for 10 days.    Dispense:  20 capsule    Refill:  0    Order Specific Question:   Supervising Provider    Answer:   Crecencio Mc [[id number]]    Return precautions given.   Risks, benefits, and alternatives of the medications and treatment plan prescribed today were discussed, and patient expressed understanding.  Education regarding symptom management and diagnosis given to patient on AVS.  Continue to follow with McLean-Scocuzza, Nino Glow, MD for routine health maintenance.   Rinaldo Cloud and I agreed with plan.   Mable Paris, FNP

## 2022-06-05 NOTE — Patient Instructions (Addendum)
Please take affrin and sudafed prior to flying on Friday to reduce pressure in your ear.   Start cefdinir  Ensure to take probiotics while on antibiotics and also for 2 weeks after completion. This can either be by eating yogurt daily or taking a probiotic supplement over the counter such as Culturelle.It is important to re-colonize the gut with good bacteria and also to prevent any diarrheal infections associated with antibiotic use.   Nice to see meet you.   Otitis Media, Adult  Otitis media is a condition in which the middle ear is red and swollen (inflamed) and full of fluid. The middle ear is the part of the ear that contains bones for hearing as well as air that helps send sounds to the brain. The condition usually goes away on its own. What are the causes? This condition is caused by a blockage in the eustachian tube. This tube connects the middle ear to the back of the nose. It normally allows air into the middle ear. The blockage is caused by fluid or swelling. Problems that can cause blockage include: A cold or infection that affects the nose, mouth, or throat. Allergies. An irritant, such as tobacco smoke. Adenoids that have become large. The adenoids are soft tissue located in the back of the throat, behind the nose and the roof of the mouth. Growth or swelling in the upper part of the throat, just behind the nose (nasopharynx). Damage to the ear caused by a change in pressure. This is called barotrauma. What increases the risk? You are more likely to develop this condition if you: Smoke or are exposed to tobacco smoke. Have an opening in the roof of your mouth (cleft palate). Have acid reflux. Have problems in your body's defense system (immune system). What are the signs or symptoms? Symptoms of this condition include: Ear pain. Fever. Problems with hearing. Being tired. Fluid leaking from the ear. Ringing in the ear. How is this treated? This condition can go away on  its own within 3-5 days. But if the condition is caused by germs (bacteria) and does not go away on its own, or if it keeps coming back, your doctor may: Give you antibiotic medicines. Give you medicines for pain. Follow these instructions at home: Take over-the-counter and prescription medicines only as told by your doctor. If you were prescribed an antibiotic medicine, take it as told by your doctor. Do not stop taking it even if you start to feel better. Keep all follow-up visits. Contact a doctor if: You have bleeding from your nose. There is a lump on your neck. You are not feeling better in 5 days. You feel worse instead of better. Get help right away if: You have pain that is not helped with medicine. You have swelling, redness, or pain around your ear. You get a stiff neck. You cannot move part of your face (paralysis). You notice that the bone behind your ear hurts when you touch it. You get a very bad headache. Summary Otitis media means that the middle ear is red, swollen, and full of fluid. This condition usually goes away on its own. If the problem does not go away, treatment may be needed. You may be given medicines to treat the infection or to treat your pain. If you were prescribed an antibiotic medicine, take it as told by your doctor. Do not stop taking it even if you start to feel better. Keep all follow-up visits. This information is not intended to  replace advice given to you by your health care provider. Make sure you discuss any questions you have with your health care provider. Document Revised: 03/05/2021 Document Reviewed: 03/05/2021 Elsevier Patient Education  [place].

## 2022-06-28 ENCOUNTER — Other Ambulatory Visit: Payer: Self-pay | Admitting: Internal Medicine

## 2022-06-28 DIAGNOSIS — G4700 Insomnia, unspecified: Secondary | ICD-10-CM

## 2022-07-22 ENCOUNTER — Telehealth: Payer: Self-pay | Admitting: Internal Medicine

## 2022-07-22 ENCOUNTER — Other Ambulatory Visit: Payer: Self-pay | Admitting: Family

## 2022-07-22 DIAGNOSIS — E611 Iron deficiency: Secondary | ICD-10-CM

## 2022-07-22 DIAGNOSIS — Z0000 Encounter for general adult medical examination without abnormal findings: Secondary | ICD-10-CM

## 2022-07-22 DIAGNOSIS — E559 Vitamin D deficiency, unspecified: Secondary | ICD-10-CM

## 2022-07-22 DIAGNOSIS — D510 Vitamin B12 deficiency anemia due to intrinsic factor deficiency: Secondary | ICD-10-CM

## 2022-07-22 NOTE — Telephone Encounter (Signed)
Patient called and is scheduled for a physical on 08/06/2022. Patient wanted to know if Dr Aundra Dubin wanted her to do labs before appointment.

## 2022-07-23 ENCOUNTER — Telehealth: Payer: Self-pay

## 2022-07-23 NOTE — Telephone Encounter (Signed)
Kennyth Arnold, FNP  You 11 hours ago (9:34 PM)    Labs placed    You  Dutch Quint B, FNP 19 hours ago (1:57 PM)    Vernie Murders,  I am not sure if Dr. Olivia Mackie wants to just do labs at the appointment or before. She is scheduled 08-06-22 @ 10 am.    Ethel Rana, Lydia Guiles routed conversation to You 19 hours ago (1:33 PM)   Ethel Rana, Lydia Guiles 19 hours ago (1:33 PM)   [place] Patient called and is scheduled for a physical on 08/06/2022. Patient wanted to know if Dr Aundra Dubin wanted her to do labs before appointment.      Note   Called to get pt sched LMOM to CB

## 2022-07-23 NOTE — Telephone Encounter (Signed)
LMOM for pt to CB to get Sched

## 2022-07-26 ENCOUNTER — Telehealth: Payer: Self-pay | Admitting: Internal Medicine

## 2022-07-26 NOTE — Telephone Encounter (Signed)
Patient has a lab appt 07/29/2022, there are no orders in.

## 2022-07-29 ENCOUNTER — Other Ambulatory Visit: Payer: 59

## 2022-07-29 ENCOUNTER — Other Ambulatory Visit: Payer: Self-pay | Admitting: Internal Medicine

## 2022-07-29 ENCOUNTER — Telehealth: Payer: Self-pay

## 2022-07-29 DIAGNOSIS — Z0000 Encounter for general adult medical examination without abnormal findings: Secondary | ICD-10-CM

## 2022-07-29 DIAGNOSIS — E559 Vitamin D deficiency, unspecified: Secondary | ICD-10-CM

## 2022-07-29 DIAGNOSIS — Z1389 Encounter for screening for other disorder: Secondary | ICD-10-CM

## 2022-07-29 DIAGNOSIS — Z13818 Encounter for screening for other digestive system disorders: Secondary | ICD-10-CM

## 2022-07-29 DIAGNOSIS — Z1329 Encounter for screening for other suspected endocrine disorder: Secondary | ICD-10-CM

## 2022-07-29 DIAGNOSIS — Z13220 Encounter for screening for lipoid disorders: Secondary | ICD-10-CM

## 2022-07-29 NOTE — Telephone Encounter (Signed)
[facility] lab orders in please be fasting 8-12 hours prior to labs sorry for this did not know she was scheduled and did not have orders  Please call pt apologize and reschedule labs please  Thank you

## 2022-07-29 NOTE — Telephone Encounter (Addendum)
Please call patient and reschedule lab appt. There are no lab future orders. Please allow time for PCP to review.  Thanks

## 2022-07-29 NOTE — Telephone Encounter (Signed)
Pt called stating someone called her stating she had lab orders in so pt made the appointment on 8/15 now pt called back today stating she had to cancel her appointment because there were no lab orders in. I let pt know someone will call her back regarding this

## 2022-07-29 NOTE — Telephone Encounter (Signed)
LMOM for pt to CB and get re scheduled for lab    Leeanne Rio, CMA routed conversation to You; McLean-Scocuzza, Nino Glow, MD 36 minutes ago (8:16 AM)   Leeanne Rio, CMA 36 minutes ago (8:16 AM)    Please call patient and reschedule lab appt. There are no lab future orders. Please allow time for PCP to review.   Thanks      Note    You routed conversation to Kennyth Arnold, FNP 3 days ago   Dennard Schaumann, Marlaine Hind, [place] routed conversation to [employer]; McLean-Scocuzza, Nino Glow, MD 3 days ago   Neta Ehlers, [place] 3 days ago    Patient has a lab appt 07/29/2022, there are no orders in.      Note

## 2022-07-30 ENCOUNTER — Telehealth: Payer: Self-pay

## 2022-07-30 NOTE — Telephone Encounter (Signed)
Spoke with pt to get her scheduled for labs put her down for 8/23 @ 10 am pt stated she may or may not CB to reschedule    You  McLean-Scocuzza, Nino Glow, MD Just now (11:02 AM)    Called got pt scheduled for 8/23 @ 10 am for labs pt stated she may or may not call back to get rescheduled     McLean-Scocuzza, Nino Glow, MD  You 16 hours ago (6:56 PM)   [facility] lab orders in please be fasting 8-12 hours prior to labs sorry for this did not know she was scheduled and did not have orders  Please call pt apologize and reschedule labs please  Thank you

## 2022-07-31 ENCOUNTER — Other Ambulatory Visit (INDEPENDENT_AMBULATORY_CARE_PROVIDER_SITE_OTHER): Payer: 59

## 2022-07-31 DIAGNOSIS — Z13220 Encounter for screening for lipoid disorders: Secondary | ICD-10-CM

## 2022-07-31 DIAGNOSIS — Z13818 Encounter for screening for other digestive system disorders: Secondary | ICD-10-CM

## 2022-07-31 DIAGNOSIS — Z1329 Encounter for screening for other suspected endocrine disorder: Secondary | ICD-10-CM | POA: Diagnosis not present

## 2022-07-31 DIAGNOSIS — Z0000 Encounter for general adult medical examination without abnormal findings: Secondary | ICD-10-CM | POA: Diagnosis not present

## 2022-07-31 DIAGNOSIS — E559 Vitamin D deficiency, unspecified: Secondary | ICD-10-CM

## 2022-07-31 DIAGNOSIS — Z1389 Encounter for screening for other disorder: Secondary | ICD-10-CM

## 2022-07-31 LAB — CBC WITH DIFFERENTIAL/PLATELET
Basophils Absolute: 0 10*3/uL (ref 0.0–0.1)
Basophils Relative: 1.1 % (ref 0.0–3.0)
Eosinophils Absolute: 0.1 10*3/uL (ref 0.0–0.7)
Eosinophils Relative: 3.3 % (ref 0.0–5.0)
HCT: 41 % (ref 36.0–46.0)
Hemoglobin: 13.3 g/dL (ref 12.0–15.0)
Lymphocytes Relative: 32.9 % (ref 12.0–46.0)
Lymphs Abs: 1.4 10*3/uL (ref 0.7–4.0)
MCHC: 32.4 g/dL (ref 30.0–36.0)
MCV: 89.6 fl (ref 78.0–100.0)
Monocytes Absolute: 0.3 10*3/uL (ref 0.1–1.0)
Monocytes Relative: 7.3 % (ref 3.0–12.0)
Neutro Abs: 2.3 10*3/uL (ref 1.4–7.7)
Neutrophils Relative %: 55.4 % (ref 43.0–77.0)
Platelets: 243 10*3/uL (ref 150.0–400.0)
RBC: 4.58 Mil/uL (ref 3.87–5.11)
RDW: 13.5 % (ref 11.5–15.5)
WBC: 4.2 10*3/uL (ref 4.0–10.5)

## 2022-07-31 LAB — COMPREHENSIVE METABOLIC PANEL
ALT: 12 U/L (ref 0–35)
AST: 14 U/L (ref 0–37)
Albumin: 4 g/dL (ref 3.5–5.2)
Alkaline Phosphatase: 48 U/L (ref 39–117)
BUN: 9 mg/dL (ref 6–23)
CO2: 28 mEq/L (ref 19–32)
Calcium: 9.1 mg/dL (ref 8.4–10.5)
Chloride: 105 mEq/L (ref 96–112)
Creatinine, Ser: 0.56 mg/dL (ref 0.40–1.20)
GFR: 113.4 mL/min (ref >60.00)
Glucose, Bld: 90 mg/dL (ref 70–99)
Potassium: 4.5 mEq/L (ref 3.5–5.1)
Sodium: 137 mEq/L (ref 135–145)
Total Bilirubin: 0.2 mg/dL (ref 0.2–1.2)
Total Protein: 6.4 g/dL (ref 6.0–8.3)

## 2022-07-31 LAB — VITAMIN D 25 HYDROXY (VIT D DEFICIENCY, FRACTURES): VITD: 67.92 ng/mL (ref 30.00–100.00)

## 2022-07-31 LAB — T4, FREE: Free T4: 0.84 ng/dL (ref 0.60–1.60)

## 2022-07-31 LAB — LIPID PANEL
Cholesterol: 147 mg/dL (ref 0–200)
HDL: 50.6 mg/dL (ref >39.00)
LDL Cholesterol: 84 mg/dL (ref 0–99)
NonHDL: 96.19
Total CHOL/HDL Ratio: 3
Triglycerides: 63 mg/dL (ref 0.0–149.0)
VLDL: 12.6 mg/dL (ref 0.0–40.0)

## 2022-07-31 LAB — TSH: TSH: 1.86 u[IU]/mL (ref 0.35–5.50)

## 2022-08-01 ENCOUNTER — Telehealth: Payer: Self-pay

## 2022-08-01 LAB — URINALYSIS, ROUTINE W REFLEX MICROSCOPIC
Bilirubin Urine: NEGATIVE
Glucose, UA: NEGATIVE
Hgb urine dipstick: NEGATIVE
Ketones, ur: NEGATIVE
Leukocytes,Ua: NEGATIVE
Nitrite: NEGATIVE
Protein, ur: NEGATIVE
Specific Gravity, Urine: 1.006 (ref 1.001–1.035)
pH: 7.5 (ref 5.0–8.0)

## 2022-08-01 LAB — HEPATITIS C ANTIBODY: Hepatitis C Ab: NONREACTIVE

## 2022-08-01 NOTE — Telephone Encounter (Signed)
Was trying to return pts call she did not answer. I did not leave a msg this time as I saw she viewed her lab results on via MyChart if she has any questions or concerns she can call back.

## 2022-08-01 NOTE — Telephone Encounter (Signed)
Patient returned call regarding results. 

## 2022-08-01 NOTE — Telephone Encounter (Signed)
LMOM for pt to CB in regards to labs 

## 2022-08-06 ENCOUNTER — Ambulatory Visit (INDEPENDENT_AMBULATORY_CARE_PROVIDER_SITE_OTHER): Payer: 59 | Admitting: Internal Medicine

## 2022-08-06 ENCOUNTER — Encounter: Payer: Self-pay | Admitting: Internal Medicine

## 2022-08-06 ENCOUNTER — Other Ambulatory Visit (HOSPITAL_COMMUNITY)
Admission: RE | Admit: 2022-08-06 | Discharge: 2022-08-06 | Disposition: A | Discharge status: Home / Self Care | Payer: 59 | Source: Ambulatory Visit | Attending: Internal Medicine | Admitting: Internal Medicine

## 2022-08-06 VITALS — BP 118/70 | HR 77 | Temp 98.1°F | Ht 70.0 in | Wt 178.2 lb

## 2022-08-06 DIAGNOSIS — F419 Anxiety disorder, unspecified: Secondary | ICD-10-CM | POA: Diagnosis not present

## 2022-08-06 DIAGNOSIS — Z1231 Encounter for screening mammogram for malignant neoplasm of breast: Secondary | ICD-10-CM

## 2022-08-06 DIAGNOSIS — Z0000 Encounter for general adult medical examination without abnormal findings: Secondary | ICD-10-CM | POA: Diagnosis not present

## 2022-08-06 DIAGNOSIS — Z124 Encounter for screening for malignant neoplasm of cervix: Secondary | ICD-10-CM | POA: Insufficient documentation

## 2022-08-06 DIAGNOSIS — N938 Other specified abnormal uterine and vaginal bleeding: Secondary | ICD-10-CM

## 2022-08-06 DIAGNOSIS — G4700 Insomnia, unspecified: Secondary | ICD-10-CM

## 2022-08-06 DIAGNOSIS — N841 Polyp of cervix uteri: Secondary | ICD-10-CM

## 2022-08-06 MED ORDER — ZOLPIDEM TARTRATE 5 MG PO TABS: ORAL_TABLET | ORAL | 5 refills | Status: DC

## 2022-08-06 MED ORDER — ZOLPIDEM TARTRATE 5 MG PO TABS: ORAL_TABLET | ORAL | 1 refills | Status: DC

## 2022-08-06 MED ORDER — HYDROXYZINE HCL 50 MG PO TABS: ORAL_TABLET | ORAL | 11 refills | Status: DC

## 2022-08-06 MED ORDER — CLONAZEPAM 0.5 MG PO TABS: ORAL_TABLET | ORAL | 5 refills | Status: DC

## 2022-08-06 NOTE — Patient Instructions (Addendum)
Tdap due 06/16/2027   Cervical polyp [facility] [address] [place], [place] [address] [phone]

## 2022-08-06 NOTE — Progress Notes (Addendum)
Chief Complaint  Patient presents with   Annual Exam    With pap   Annual  Pap due today  Needs refills of chronic medications anxiety and insomnia  Wt loss goal 170 lost wt doing 1800 calorie restriction and healthy diet choices     Review of Systems  Constitutional:  Negative for weight loss.  HENT:  Negative for hearing loss.   Eyes:  Negative for blurred vision.  Respiratory:  Negative for shortness of breath.   Cardiovascular:  Negative for chest pain.  Gastrointestinal:  Negative for abdominal pain and blood in stool.  Genitourinary:  Negative for dysuria.  Musculoskeletal:  Negative for falls and joint pain.  Skin:  Negative for rash.  Neurological:  Negative for headaches.  Psychiatric/Behavioral:  Negative for depression.    Past Medical History:  Diagnosis Date   Anemia    Chronic insomnia    IUD complication ([place])    spontaneously came out while on honeymoon   Migraine    Preeclampsia    Sinus infection    Tourette syndrome    Urinary tract infection    Past Surgical History:  Procedure Laterality Date   WISDOM TOOTH EXTRACTION     Family History  Problem Relation Age of Onset   Depression Mother    Anxiety disorder Mother    Heart disease Father    Hypertension Father    Diabetes Father    Tourette syndrome Father    Lymphoma Father        ? pre CLL vs other blood cancer 77/78   Breast cancer Neg Hx    Social History   Socioeconomic History   Marital status: Married    Spouse name: Not on file   Number of children: Not on file   Years of education: Not on file   Highest education level: Not on file  Occupational History   Not on file  Tobacco Use   Smoking status: Former   Smokeless tobacco: Never   Tobacco comments:    Education officer, community Use   Vaping Use: Never used  Substance and Sexual Activity   Alcohol use: No   Drug use: Not on file   Sexual activity: Not on file  Other Topics Concern   Not on file  Social History  Narrative   Lives in [place].      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   Cholecalciferol (VITAMIN D3) 1.25 MG (50000 UT) CAPS TAKE 1 CAPSULE BY MOUTH 1 TIME A WEEK   ferrous sulfate 325 (65 FE) MG tablet Take by mouth daily.   vitamin B-12 (CYANOCOBALAMIN) 1000 MCG tablet Take 1,000 mcg by mouth daily.   [DISCONTINUED] clonazePAM (KLONOPIN) 0.5 MG tablet TAKE 1 TABLET(0.5 MG) BY MOUTH AT BEDTIME AS NEEDED FOR ANXIETY   [DISCONTINUED] hydrOXYzine (ATARAX) 50 MG tablet TAKE 1 TABLET(50 MG) BY MOUTH THREE TIMES DAILY  AS NEEDED   [DISCONTINUED] zolpidem (AMBIEN) 5 MG tablet TAKE 1 TABLET(5 MG) BY MOUTH AT BEDTIME AS NEEDED   Allergies  Allergen Reactions   Abilify [Aripiprazole]     Hallucinations     Augmentin [Amoxicillin-Pot Clavulanate] Nausea Only   Recent Results (from the past 2160 hour(s))  POCT rapid strep A     Status: Normal   Collection Time: 06/05/22 12:09 PM  Result Value Ref Range   Rapid Strep A Screen Negative Negative  POC COVID-19     Status: None   Collection Time: 06/05/22 12:16 PM  Result Value Ref Range   SARS Coronavirus 2 Ag Negative Negative  Hepatitis C antibody     Status: None   Collection Time: 07/31/22  9:47 AM  Result Value Ref Range   Hepatitis C Ab NON-REACTIVE NON-REACTIVE    Comment: . HCV antibody was non-reactive. There is no laboratory  evidence of HCV infection. . In most cases, no further action is required. However, if recent HCV exposure is suspected, a test for HCV  RNA (test code [phone]) is suggested. . For additional information please refer to http://education.questdiagn[facility].com/faq/FAQ22v1 (This link is being provided for informational/ educational purposes only.) .   T4, free     Status: None   Collection Time: 07/31/22  9:47 AM  Result Value Ref Range   Free T4 0.84 0.60 - 1.60 ng/dL    Comment: Specimens from patients who are undergoing biotin therapy and /or ingesting biotin supplements may contain high levels of biotin.  The higher biotin concentration in these specimens interferes with this Free T4 assay.  Specimens that contain high levels  of biotin may cause false high results for this Free T4 assay.  Please interpret results in light of the total clinical presentation of the patient.    Vitamin D (25 hydroxy)     Status: None   Collection Time: 07/31/22  9:47 AM  Result Value Ref Range   VITD 67.92 30.00 - 100.00 ng/mL  Urinalysis, Routine w reflex microscopic     Status: None   Collection Time: 07/31/22  9:47 AM  Result Value Ref Range   Color, Urine YELLOW YELLOW   APPearance CLEAR CLEAR   Specific Gravity, Urine 1.006 1.001 - 1.035   pH 7.5 5.0 - 8.0   Glucose, UA NEGATIVE NEGATIVE   Bilirubin Urine NEGATIVE NEGATIVE   Ketones, ur NEGATIVE NEGATIVE   Hgb urine dipstick NEGATIVE NEGATIVE   Protein, ur NEGATIVE NEGATIVE   Nitrite NEGATIVE NEGATIVE   Leukocytes,Ua NEGATIVE NEGATIVE  TSH     Status: None   Collection Time: 07/31/22  9:47 AM  Result Value Ref Range   TSH 1.86 0.35 - 5.50 uIU/mL  CBC with Differential/Platelet     Status: None   Collection Time: 07/31/22  9:47 AM  Result Value Ref Range   WBC 4.2 4.0 - 10.5 K/uL   RBC 4.58 3.87 - 5.11 Mil/uL   Hemoglobin 13.3 12.0 - 15.0 g/dL   HCT 41.0 36.0 - 46.0 %   MCV 89.6 78.0 - 100.0 fl   MCHC 32.4 30.0 - 36.0 g/dL   RDW 13.5 11.5 - 15.5 %   Platelets 243.0 150.0 - 400.0 K/uL   Neutrophils Relative % 55.4 43.0 - 77.0 %   Lymphocytes Relative 32.9 12.0 - 46.0 %    Monocytes Relative 7.3 3.0 - 12.0 %   Eosinophils Relative 3.3 0.0 - 5.0 %   Basophils Relative 1.1 0.0 - 3.0 %  Neutro Abs 2.3 1.4 - 7.7 K/uL   Lymphs Abs 1.4 0.7 - 4.0 K/uL   Monocytes Absolute 0.3 0.1 - 1.0 K/uL   Eosinophils Absolute 0.1 0.0 - 0.7 K/uL   Basophils Absolute 0.0 0.0 - 0.1 K/uL  Lipid panel     Status: None   Collection Time: 07/31/22  9:47 AM  Result Value Ref Range   Cholesterol 147 0 - 200 mg/dL    Comment: ATP III Classification       Desirable:  < 200 mg/dL               Borderline High:  200 - 239 mg/dL          High:  > = 240 mg/dL   Triglycerides 63.0 0.0 - 149.0 mg/dL    Comment: Normal:  <150 mg/dLBorderline High:  150 - 199 mg/dL   HDL 50.60 >39.00 mg/dL   VLDL 12.6 0.0 - 40.0 mg/dL   LDL Cholesterol 84 0 - 99 mg/dL   Total CHOL/HDL Ratio 3     Comment:                Men          Women1/2 Average Risk     3.4          3.3Average Risk          5.0          4.42X Average Risk          9.6          7.13X Average Risk          15.0          11.0                       NonHDL 96.19     Comment: NOTE:  Non-HDL goal should be 30 mg/dL higher than patient's LDL goal (i.e. LDL goal of < 70 mg/dL, would have non-HDL goal of < 100 mg/dL)  Comprehensive metabolic panel     Status: None   Collection Time: 07/31/22  9:47 AM  Result Value Ref Range   Sodium 137 135 - 145 mEq/L   Potassium 4.5 3.5 - 5.1 mEq/L   Chloride 105 96 - 112 mEq/L   CO2 28 19 - 32 mEq/L   Glucose, Bld 90 70 - 99 mg/dL   BUN 9 6 - 23 mg/dL   Creatinine, Ser 0.56 0.40 - 1.20 mg/dL   Total Bilirubin 0.2 0.2 - 1.2 mg/dL   Alkaline Phosphatase 48 39 - 117 U/L   AST 14 0 - 37 U/L   ALT 12 0 - 35 U/L   Total Protein 6.4 6.0 - 8.3 g/dL   Albumin 4.0 3.5 - 5.2 g/dL   GFR 113.40 >60.00 mL/min    Comment: Calculated using the CKD-EPI Creatinine Equation (2021)   Calcium 9.1 8.4 - 10.5 mg/dL   Objective  Body mass index is 25.57 kg/m. Wt Readings from Last 3 Encounters:  08/06/22 178 lb 3.2  oz (80.8 kg)  06/05/22 183 lb 4 oz (83.1 kg)  07/19/21 209 lb 12.8 oz (95.2 kg)   Temp Readings from Last 3 Encounters:  08/06/22 98.1 F (36.7 C) (Oral)  06/05/22 98.1 F (36.7 C) (Oral)  07/19/21 (!) 96.6 F (35.9 C) (Temporal)   BP Readings from Last 3 Encounters:  08/06/22 118/70  06/05/22 112/72  07/19/21 110/80   Pulse Readings from Last 3  Encounters:  08/06/22 77  06/05/22 89  07/19/21 80    Physical Exam Vitals and nursing note reviewed.  Constitutional:      Appearance: Normal appearance. She is well-developed and well-groomed.  HENT:     Head: Normocephalic and atraumatic.  Eyes:     Conjunctiva/sclera: Conjunctivae normal.     Pupils: Pupils are equal, round, and reactive to light.  Cardiovascular:     Rate and Rhythm: Normal rate and regular rhythm.     Heart sounds: Normal heart sounds. No murmur heard. Pulmonary:     Effort: Pulmonary effort is normal.     Breath sounds: Normal breath sounds.  Chest:     Chest wall: No mass.  Breasts:    Breasts are symmetrical.     Right: Normal.     Left: Inverted nipple present.     Comments: Stable left inverted nipple Abdominal:     General: Abdomen is flat. Bowel sounds are normal.     Tenderness: There is no abdominal tenderness.  Genitourinary:    Pubic Area: No rash.      Labia:        Right: No rash.        Left: No rash.      Vagina: Normal.     Cervix: Friability present.     Adnexa: Right adnexa normal and left adnexa normal.     Comments: +cervical polyp 12 oclok  Musculoskeletal:        General: No tenderness.  Lymphadenopathy:     Upper Body:     Right upper body: No axillary adenopathy.     Left upper body: No axillary adenopathy.  Skin:    General: Skin is warm and dry.  Neurological:     General: No focal deficit present.     Mental Status: She is alert and oriented to person, place, and time. Mental status is at baseline.     Cranial Nerves: Cranial nerves 2-12 are intact.      Motor: Motor function is intact.     Coordination: Coordination is intact.     Gait: Gait is intact.  Psychiatric:        Attention and Perception: Attention and perception normal.        Mood and Affect: Mood and affect normal.        Speech: Speech normal.        Behavior: Behavior normal. Behavior is cooperative.        Thought Content: Thought content normal.        Cognition and Memory: Cognition and memory normal.        Judgment: Judgment normal.     Assessment  Plan  Annual physical exam See below   Screening mammogram, encounter for - Plan: MM 3D SCREEN BREAST BILATERAL  Anxiety  controlled- Plan: clonazePAM (KLONOPIN) 0.5 MG tablet, hydrOXYzine (ATARAX) 50 MG tablet  Insomnia, unspecified type - Plan: zolpidem (AMBIEN) 5 MG tablet, DISCONTINUED: zolpidem (AMBIEN) 5 MG tablet  Routine cervical smear - Plan: Cytology - PAP( [facility]) Cervical polyp - Plan: Ambulatory referral to Obstetrics / Gynecology DUB (dysfunctional uterine bleeding) with mid cycle bleeding - Plan: Ambulatory referral to Obstetrics / Gynecology   HM Fasting labs utd 07/31/22 normal   Flu shot declines Covid shot declines Tdap 06/15/17   Mammogram ordered 05/22/22 negative  Ordered 05/2023  Pap Dr. Garwin Brothers h/o abnormal and HPV in 2012  -->Pap 02/02/19 neg HPV neg pap  Pap today referred ob/gyn today  c/w cervical polyp   Colonoscopy age 7 rec.    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 Olivia Mackie [facility]

## 2022-08-09 LAB — CYTOLOGY - PAP
Comment: NEGATIVE
Diagnosis: NEGATIVE
High risk HPV: NEGATIVE

## 2022-12-10 ENCOUNTER — Telehealth: Payer: Self-pay | Admitting: Internal Medicine

## 2022-12-10 NOTE — Telephone Encounter (Signed)
Pt called in today asking the status of her referral. She was Dr. Olivia Mackie pt and as per pt Olivia Mackie had put in an OBG referral. She never had a call back to follow up.

## 2023-02-06 ENCOUNTER — Encounter: Payer: Self-pay | Admitting: Family Medicine

## 2023-02-06 ENCOUNTER — Ambulatory Visit (INDEPENDENT_AMBULATORY_CARE_PROVIDER_SITE_OTHER): Payer: 59 | Admitting: Family Medicine

## 2023-02-06 VITALS — BP 99/60 | HR 73 | Temp 98.4°F | Ht 70.0 in | Wt 176.6 lb

## 2023-02-06 DIAGNOSIS — E559 Vitamin D deficiency, unspecified: Secondary | ICD-10-CM

## 2023-02-06 DIAGNOSIS — F39 Unspecified mood [affective] disorder: Secondary | ICD-10-CM | POA: Diagnosis not present

## 2023-02-06 DIAGNOSIS — Z8659 Personal history of other mental and behavioral disorders: Secondary | ICD-10-CM

## 2023-02-06 DIAGNOSIS — Z13220 Encounter for screening for lipoid disorders: Secondary | ICD-10-CM

## 2023-02-06 DIAGNOSIS — E611 Iron deficiency: Secondary | ICD-10-CM

## 2023-02-06 DIAGNOSIS — F5104 Psychophysiologic insomnia: Secondary | ICD-10-CM | POA: Diagnosis not present

## 2023-02-06 DIAGNOSIS — F419 Anxiety disorder, unspecified: Secondary | ICD-10-CM

## 2023-02-06 DIAGNOSIS — Z1329 Encounter for screening for other suspected endocrine disorder: Secondary | ICD-10-CM

## 2023-02-06 DIAGNOSIS — G4700 Insomnia, unspecified: Secondary | ICD-10-CM

## 2023-02-06 MED ORDER — HYDROXYZINE HCL 50 MG PO TABS: ORAL_TABLET | ORAL | 1 refills | Status: AC

## 2023-02-06 NOTE — Patient Instructions (Addendum)
It was a pleasure meeting you today. Thank you for allowing me to take part in your health care.  Our goals for today as we discussed include:  Schedule annual physical in August. Schedule lab appointment 1 week prior to office visit.  Fast for 10 hours prior to lab visit.  Recommend limited use of clonazepam and Ambien. Can use Atarax 25 mg daily as needed. Continue Atarax 50 mg at night.    If you have any questions or concerns, please do not hesitate to call the office at [phone].  I look forward to our next visit and until then take care and stay safe.  Regards,   Carollee Leitz, MD   [facility]

## 2023-02-06 NOTE — Progress Notes (Signed)
SUBJECTIVE:   Chief Complaint  Patient presents with   Transitions Of Care    Hair shedding   HPI Patient presents to clinic to transfer care.  Concerns today include cervical polyp and increased shedding of hair.  Mood disorder Long history of anxiety/sleep disorder.  Has been taking  Klonopin since 2010 but has reduced dose 0.5 mg at night as needed.  Reports taking medication only when necessary.  She also takes hydroxyzine 50 mg nightly and zolpidem 5 mg nightly.  Reports was previously seen by psychiatry in the last visit 4 to 5 years ago.  Not currently seeing a therapist.  Has tried SSRIs in the past however due to side effects to discontinued.  Has had previous episodes of panic attacks.  Patient reports having a physical with former PCP who noted cervical polyp on pelvic exam.  Was referred to OB/GYN who she reports did not visualize any cervical polyps.  Hair shedding She reports always having increased hair shedding but feels like this is increasing she has had COVID.  She had asked her OB/GYN if this could be a side effect of COVID and was told it was a possibility.  Denies any loss of patches of hair or balding pattern.  PERTINENT PMH / PSH: Mood disorder, primarily anxiety Sleep disorder History of migraines IDA Tourette syndrome  OBJECTIVE:  BP 99/60   Pulse 73   Temp 98.4 F (36.9 C) (Oral)   Ht '5\' 10"'$  (1.778 m)   Wt 176 lb 9.6 oz (80.1 kg)   LMP 02/03/2023 (Exact Date)   SpO2 99%   Breastfeeding No   BMI 25.34 kg/m    Physical Exam Vitals reviewed.  Constitutional:      General: She is not in acute distress.    Appearance: She is not ill-appearing.  HENT:     Head: Normocephalic.     Nose: Nose normal.  Eyes:     Conjunctiva/sclera: Conjunctivae normal.  Neck:     Thyroid: No thyromegaly or thyroid tenderness.  Cardiovascular:     Rate and Rhythm: Normal rate and regular rhythm.     Heart sounds: Normal heart sounds.  Pulmonary:     Effort:  Pulmonary effort is normal.     Breath sounds: Normal breath sounds.  Abdominal:     General: Abdomen is flat. Bowel sounds are normal.     Palpations: Abdomen is soft.  Musculoskeletal:        General: Normal range of motion.     Cervical back: Normal range of motion.  Neurological:     Mental Status: She is alert and oriented to person, place, and time. Mental status is at baseline.  Psychiatric:        Mood and Affect: Mood normal.        Behavior: Behavior normal.        Thought Content: Thought content normal.        Judgment: Judgment normal.     ASSESSMENT/PLAN:  Mood disorder (HCC) Assessment & Plan: Chronic.  Primarily anxiety Long-term use benzodiazepine Continue Klonopin 0.5 mg as needed.  Recommend using sparingly to avoid increasing side effects Use Atarax 25 mg daily, can increase to 25 mg twice a day and continue 50 mg at night. Follow-up in 6 months or sooner as needed.  Orders: -     hydrOXYzine HCl; TAKE 1 TABLET(50 MG) BY MOUTH THREE TIMES DAILY AS NEEDED  Dispense: 90 tablet; Refill: 1 -     Vitamin  B12; Future -     Comprehensive metabolic panel; Future  Vitamin D deficiency Assessment & Plan: Check vitamin D levels   Orders: -     VITAMIN D 25 Hydroxy (Vit-D Deficiency, Fractures); Future  History of panic disorder Assessment & Plan: Chronic. Encourage CBT Offered psychiatry referral, patient declined    Chronic insomnia Assessment & Plan: Chronic.  Stable. Long-term use of Ambien and Atarax Continue Ambien 5 mg nightly nightly Continue Atarax 50 mg nightly   Iron deficiency -     CBC with Differential/Platelet; Future  Thyroid disorder screen -     TSH; Future  Lipid screening -     Lipid panel; Future   PDMP reviewed  Return in about 6 months (around 08/07/2023) for annual visit with fasting labs 1 week prior.  Carollee Leitz, MD

## 2023-02-08 ENCOUNTER — Encounter: Payer: Self-pay | Admitting: Family Medicine

## 2023-02-08 DIAGNOSIS — F39 Unspecified mood [affective] disorder: Secondary | ICD-10-CM | POA: Insufficient documentation

## 2023-02-08 DIAGNOSIS — Z1329 Encounter for screening for other suspected endocrine disorder: Secondary | ICD-10-CM | POA: Insufficient documentation

## 2023-02-08 DIAGNOSIS — E611 Iron deficiency: Secondary | ICD-10-CM | POA: Insufficient documentation

## 2023-02-08 DIAGNOSIS — Z13220 Encounter for screening for lipoid disorders: Secondary | ICD-10-CM | POA: Insufficient documentation

## 2023-02-08 NOTE — Assessment & Plan Note (Signed)
Chronic.  Primarily anxiety Long-term use benzodiazepine Continue Klonopin 0.5 mg as needed.  Recommend using sparingly to avoid increasing side effects Use Atarax 25 mg daily, can increase to 25 mg twice a day and continue 50 mg at night. Follow-up in 6 months or sooner as needed.

## 2023-02-08 NOTE — Assessment & Plan Note (Signed)
Check vitamin D levels

## 2023-02-08 NOTE — Assessment & Plan Note (Signed)
Chronic.  Stable. Long-term use of Ambien and Atarax Continue Ambien 5 mg nightly nightly Continue Atarax 50 mg nightly

## 2023-02-08 NOTE — Assessment & Plan Note (Signed)
Chronic. Encourage CBT Offered psychiatry referral, patient declined

## 2023-02-21 ENCOUNTER — Telehealth: Payer: Self-pay | Admitting: Family Medicine

## 2023-02-21 DIAGNOSIS — G4700 Insomnia, unspecified: Secondary | ICD-10-CM

## 2023-02-21 MED ORDER — ZOLPIDEM TARTRATE 5 MG PO TABS: ORAL_TABLET | ORAL | 0 refills | Status: DC

## 2023-02-21 NOTE — Telephone Encounter (Signed)
Prescription Request  02/21/2023  LOV: 02/06/2023  What is the name of the medication or equipment? zolpidem (AMBIEN) 5 MG tablet  Have you contacted your pharmacy to request a refill? Yes   Which pharmacy would you like this sent to?  [facility] [place], [address] AT [address] [place] [address] Phone: [phone] Fax: [phone]    Patient notified that their request is being sent to the clinical staff for review and that they should receive a response within 2 business days.   Please advise at Mobile [phone] (mobile)

## 2023-02-21 NOTE — Telephone Encounter (Signed)
Sent to pharmacy.  Controlled substance database reviewed. 

## 2023-03-24 ENCOUNTER — Other Ambulatory Visit: Payer: Self-pay | Admitting: Family Medicine

## 2023-03-24 DIAGNOSIS — G4700 Insomnia, unspecified: Secondary | ICD-10-CM

## 2023-03-24 NOTE — Telephone Encounter (Signed)
Refilled: 02/21/2023 Last OV: 02/06/2023 Next OV: 08/20/2023

## 2023-03-25 ENCOUNTER — Other Ambulatory Visit: Payer: Self-pay | Admitting: *Deleted

## 2023-03-25 DIAGNOSIS — E559 Vitamin D deficiency, unspecified: Secondary | ICD-10-CM

## 2023-03-25 MED ORDER — VITAMIN D3 1.25 MG (50000 UT) PO CAPS: ORAL_CAPSULE | ORAL | 3 refills | Status: DC

## 2023-03-25 NOTE — Telephone Encounter (Signed)
Last OV: 02/06/23  Next lab appt on: 08/12/23  Next OV: 08/20/23

## 2023-04-28 ENCOUNTER — Other Ambulatory Visit: Payer: Self-pay

## 2023-04-28 DIAGNOSIS — G4700 Insomnia, unspecified: Secondary | ICD-10-CM

## 2023-04-28 NOTE — Telephone Encounter (Signed)
Per pharmacy note, pt requested 90 day supply of medication.

## 2023-04-28 NOTE — Telephone Encounter (Signed)
Patient is requesting a refill of Ambien Last OV 02/06/23  Next OV 08/20/23

## 2023-04-28 NOTE — Telephone Encounter (Signed)
Prescription Request  04/28/2023  LOV: 02/06/2023  What is the name of the medication or equipment? zolpidem (AMBIEN) 5 MG tablet  Have you contacted your pharmacy to request a refill? Yes   Which pharmacy would you like this sent to?   [id number] [place], [place] - [address] AT [facility] [address] [place] [address] Phone: [phone] Fax: [phone]    Patient notified that their request is being sent to the clinical staff for review and that they should receive a response within 2 business days.   Please advise at Mobile [phone] (mobile)

## 2023-04-29 ENCOUNTER — Other Ambulatory Visit: Payer: Self-pay | Admitting: *Deleted

## 2023-04-29 DIAGNOSIS — G4700 Insomnia, unspecified: Secondary | ICD-10-CM

## 2023-04-29 MED ORDER — ZOLPIDEM TARTRATE 5 MG PO TABS: ORAL_TABLET | ORAL | 0 refills | Status: DC

## 2023-04-29 MED ORDER — ZOLPIDEM TARTRATE 5 MG PO TABS
ORAL_TABLET | ORAL | 2 refills | Status: DC
Start: 2023-04-29 — End: 2023-07-31

## 2023-04-29 NOTE — Telephone Encounter (Signed)
Rx has been phoned in & pt notified by front staff.

## 2023-07-24 ENCOUNTER — Encounter (INDEPENDENT_AMBULATORY_CARE_PROVIDER_SITE_OTHER): Payer: Self-pay

## 2023-07-24 ENCOUNTER — Encounter: Payer: Self-pay | Admitting: Family Medicine

## 2023-07-31 ENCOUNTER — Other Ambulatory Visit: Payer: Self-pay | Admitting: Family Medicine

## 2023-07-31 DIAGNOSIS — G4700 Insomnia, unspecified: Secondary | ICD-10-CM

## 2023-08-12 ENCOUNTER — Other Ambulatory Visit (INDEPENDENT_AMBULATORY_CARE_PROVIDER_SITE_OTHER): Payer: 59

## 2023-08-12 DIAGNOSIS — F39 Unspecified mood [affective] disorder: Secondary | ICD-10-CM | POA: Diagnosis not present

## 2023-08-12 DIAGNOSIS — E611 Iron deficiency: Secondary | ICD-10-CM | POA: Diagnosis not present

## 2023-08-12 DIAGNOSIS — E559 Vitamin D deficiency, unspecified: Secondary | ICD-10-CM | POA: Diagnosis not present

## 2023-08-12 DIAGNOSIS — Z13220 Encounter for screening for lipoid disorders: Secondary | ICD-10-CM

## 2023-08-12 DIAGNOSIS — Z1329 Encounter for screening for other suspected endocrine disorder: Secondary | ICD-10-CM

## 2023-08-12 LAB — LIPID PANEL
Cholesterol: 167 mg/dL (ref 0–200)
HDL: 55.6 mg/dL (ref >39.00)
LDL Cholesterol: 96 mg/dL (ref 0–99)
NonHDL: 111.82
Total CHOL/HDL Ratio: 3
Triglycerides: 78 mg/dL (ref 0.0–149.0)
VLDL: 15.6 mg/dL (ref 0.0–40.0)

## 2023-08-12 LAB — COMPREHENSIVE METABOLIC PANEL
ALT: 16 U/L (ref 0–35)
AST: 17 U/L (ref 0–37)
Albumin: 3.7 g/dL (ref 3.5–5.2)
Alkaline Phosphatase: 49 U/L (ref 39–117)
BUN: 12 mg/dL (ref 6–23)
CO2: 27 meq/L (ref 19–32)
Calcium: 9.3 mg/dL (ref 8.4–10.5)
Chloride: 103 meq/L (ref 96–112)
Creatinine, Ser: 0.62 mg/dL (ref 0.40–1.20)
GFR: 109.85 mL/min (ref >60.00)
Glucose, Bld: 96 mg/dL (ref 70–99)
Potassium: 4.5 meq/L (ref 3.5–5.1)
Sodium: 135 meq/L (ref 135–145)
Total Bilirubin: 0.3 mg/dL (ref 0.2–1.2)
Total Protein: 6.7 g/dL (ref 6.0–8.3)

## 2023-08-12 LAB — CBC WITH DIFFERENTIAL/PLATELET
Basophils Absolute: 0 10*3/uL (ref 0.0–0.1)
Basophils Relative: 0.6 % (ref 0.0–3.0)
Eosinophils Absolute: 0.1 10*3/uL (ref 0.0–0.7)
Eosinophils Relative: 2.2 % (ref 0.0–5.0)
HCT: 40.8 % (ref 36.0–46.0)
Hemoglobin: 13.1 g/dL (ref 12.0–15.0)
Lymphocytes Relative: 26.4 % (ref 12.0–46.0)
Lymphs Abs: 1.6 10*3/uL (ref 0.7–4.0)
MCHC: 32.2 g/dL (ref 30.0–36.0)
MCV: 94.3 fl (ref 78.0–100.0)
Monocytes Absolute: 0.4 10*3/uL (ref 0.1–1.0)
Monocytes Relative: 7.1 % (ref 3.0–12.0)
Neutro Abs: 4 10*3/uL (ref 1.4–7.7)
Neutrophils Relative %: 63.7 % (ref 43.0–77.0)
Platelets: 241 10*3/uL (ref 150.0–400.0)
RBC: 4.33 Mil/uL (ref 3.87–5.11)
RDW: 12.8 % (ref 11.5–15.5)
WBC: 6.2 10*3/uL (ref 4.0–10.5)

## 2023-08-13 ENCOUNTER — Encounter: Payer: Self-pay | Admitting: Family Medicine

## 2023-08-13 LAB — VITAMIN B12: Vitamin B-12: 592 pg/mL (ref 211–911)

## 2023-08-13 LAB — TSH: TSH: 2.24 u[IU]/mL (ref 0.35–5.50)

## 2023-08-13 LAB — VITAMIN D 25 HYDROXY (VIT D DEFICIENCY, FRACTURES): VITD: 56.79 ng/mL (ref 30.00–100.00)

## 2023-08-20 ENCOUNTER — Encounter: Payer: 59 | Admitting: Family Medicine

## 2023-09-03 ENCOUNTER — Ambulatory Visit (INDEPENDENT_AMBULATORY_CARE_PROVIDER_SITE_OTHER): Payer: 59 | Admitting: Family Medicine

## 2023-09-03 ENCOUNTER — Encounter: Payer: Self-pay | Admitting: Family Medicine

## 2023-09-03 VITALS — BP 110/68 | HR 80 | Temp 97.7°F | Resp 16 | Ht 69.75 in | Wt 188.1 lb

## 2023-09-03 DIAGNOSIS — F5104 Psychophysiologic insomnia: Secondary | ICD-10-CM | POA: Diagnosis not present

## 2023-09-03 DIAGNOSIS — F39 Unspecified mood [affective] disorder: Secondary | ICD-10-CM | POA: Diagnosis not present

## 2023-09-03 DIAGNOSIS — Z0000 Encounter for general adult medical examination without abnormal findings: Secondary | ICD-10-CM

## 2023-09-03 DIAGNOSIS — Z79899 Other long term (current) drug therapy: Secondary | ICD-10-CM

## 2023-09-03 NOTE — Patient Instructions (Addendum)
It was a pleasure meeting you today. Thank you for allowing me to take part in your health care.  Our goals for today as we discussed include:  Glad things are going well for you with the new program.  Keep me posted on how you are doing   Recommend Mammogram  Follow up as needed  This is a list of the screening recommended for you and due dates:  Health Maintenance  Topic Date Due   Flu Shot  03/08/2024*   DTaP/Tdap/Td vaccine (2 - Td or Tdap) 06/16/2027   Pap with HPV screening  08/07/2027   Hepatitis C Screening  Completed   HIV Screening  Completed   HPV Vaccine  Aged Out   COVID-19 Vaccine  Discontinued  *Topic was postponed. The date shown is not the original due date.    If you have any questions or concerns, please do not hesitate to call the office at [phone].  I look forward to our next visit and until then take care and stay safe.  Regards,   Dana Allan, MD   [facility]

## 2023-09-03 NOTE — Progress Notes (Signed)
SUBJECTIVE:   Chief Complaint  Patient presents with   Annual Exam   HPI Patient presents to clinic for annual physical  No acute concerns  Mood disorder Doing well on current medications.  Takes Klonopin 0.5 mg as needed, Atarax 50 mg daily as needed.  Takes Ambien 5 mg at night to help with sleep.  PDMP reviewed and compliant. Denies SI/HI  Recently started [employer] program in [place]. Feels like this has improved her symptoms.  Has frequent follow up with monitoring of blood work.  Home schools daughter   PERTINENT PMH / PSH: Mood disorder, primarily anxiety Sleep disorder History of migraines IDA Tourette syndrome  OBJECTIVE:  BP 110/68   Pulse 80   Temp 97.7 F (36.5 C)   Resp 16   Ht 5' 9.75" (1.772 m)   Wt 188 lb 2 oz (85.3 kg)   LMP 08/18/2023 (Exact Date)   SpO2 100%   BMI 27.19 kg/m    Physical Exam Vitals reviewed.  Constitutional:      General: She is not in acute distress.    Appearance: She is not ill-appearing.  HENT:     Head: Normocephalic.     Nose: Nose normal.  Eyes:     Conjunctiva/sclera: Conjunctivae normal.  Neck:     Thyroid: No thyromegaly or thyroid tenderness.  Cardiovascular:     Rate and Rhythm: Normal rate and regular rhythm.     Heart sounds: Normal heart sounds.  Pulmonary:     Effort: Pulmonary effort is normal.     Breath sounds: Normal breath sounds.  Abdominal:     General: Abdomen is flat. Bowel sounds are normal.     Palpations: Abdomen is soft.  Musculoskeletal:        General: Normal range of motion.     Cervical back: Normal range of motion.  Neurological:     Mental Status: She is alert and oriented to person, place, and time. Mental status is at baseline.  Psychiatric:        Mood and Affect: Mood normal.        Behavior: Behavior normal.        Thought Content: Thought content normal.        Judgment: Judgment normal.       09/03/2023    8:33 AM 02/06/2023   10:08 AM 08/06/2022   10:05  AM 07/19/2021    1:03 PM 06/14/2020   11:20 AM  Depression screen PHQ 2/9  Decreased Interest 0 0 0 0 0  Down, Depressed, Hopeless 0 0 0 0 0  PHQ - 2 Score 0 0 0 0 0  Altered sleeping 2      Tired, decreased energy 1      Change in appetite 0      Feeling bad or failure about yourself  0      Trouble concentrating 0      Moving slowly or fidgety/restless 0      Suicidal thoughts 0      PHQ-9 Score 3      Difficult doing work/chores Not difficult at all            09/03/2023    8:34 AM 06/14/2020   11:20 AM 03/07/2020   11:30 AM 09/03/2019    2:13 PM  GAD 7 : Generalized Anxiety Score  Nervous, Anxious, on Edge 0 1 2 3   Control/stop worrying 0 0 0 2  Worry too much - different things 0  0 1 2  Trouble relaxing 0 0 1 3  Restless 0 0 0 3  Easily annoyed or irritable 0 0 0 2  Afraid - awful might happen 0 0 1 2  Total GAD 7 Score 0 1 5 17   Anxiety Difficulty Not difficult at all Not difficult at all Not difficult at all Somewhat difficult      ASSESSMENT/PLAN:  Annual physical exam Assessment & Plan: Annual labs reviewed Mammogram due. Declined today Recommend self breast exams PAP up to date.  Follows with OBGN Colonoscopy at age 9 PHQ9/GAD screening Normotensive  HEP C/HIV screening completed Tetanus up to date Declined Flu vaccine   Chronic use of benzodiazepine for therapeutic purpose -     ToxASSURE Select 13 (MW), Urine  Mood disorder [facility]) Assessment & Plan: Chronic.  Primarily anxiety Long-term use benzodiazepine Continue Klonopin 0.5 mg as needed.  Recommend using sparingly to avoid increasing side effects Use Atarax 50 mg daily UDS and non opioid contract today  Orders: -     ToxASSURE Select 13 (MW), Urine  Chronic insomnia Assessment & Plan: Chronic.  Stable. Long-term use of Ambien and Atarax Continue Ambien 5 mg nightly nightly Continue Atarax 50 mg nightly UDS today Non opioid contract today  Orders: -     ToxASSURE Select 13 (MW),  Urine     PDMP reviewed  Return if symptoms worsen or fail to improve, for PCP.  Dana Allan, MD

## 2023-09-09 LAB — TOXASSURE SELECT 13 (MW), URINE

## 2023-09-10 ENCOUNTER — Encounter: Payer: Self-pay | Admitting: Family Medicine

## 2023-09-10 DIAGNOSIS — Z79899 Other long term (current) drug therapy: Secondary | ICD-10-CM | POA: Insufficient documentation

## 2023-09-10 NOTE — Assessment & Plan Note (Signed)
Chronic.  Stable. Long-term use of Ambien and Atarax Continue Ambien 5 mg nightly nightly Continue Atarax 50 mg nightly UDS today Non opioid contract today

## 2023-09-10 NOTE — Assessment & Plan Note (Addendum)
Annual labs reviewed Mammogram due. Declined today Recommend self breast exams PAP up to date.  Follows with OBGN Colonoscopy at age 42 PHQ9/GAD screening Normotensive  HEP C/HIV screening completed Tetanus up to date Declined Flu vaccine

## 2023-09-10 NOTE — Assessment & Plan Note (Signed)
Chronic.  Primarily anxiety Long-term use benzodiazepine Continue Klonopin 0.5 mg as needed.  Recommend using sparingly to avoid increasing side effects Use Atarax 50 mg daily UDS and non opioid contract today

## 2023-12-05 ENCOUNTER — Other Ambulatory Visit: Payer: Self-pay | Admitting: Family Medicine

## 2023-12-05 DIAGNOSIS — G4700 Insomnia, unspecified: Secondary | ICD-10-CM

## 2024-01-24 ENCOUNTER — Other Ambulatory Visit: Payer: Self-pay | Admitting: Family Medicine

## 2024-01-24 DIAGNOSIS — G4700 Insomnia, unspecified: Secondary | ICD-10-CM

## 2024-01-26 ENCOUNTER — Other Ambulatory Visit: Payer: Self-pay

## 2024-01-26 DIAGNOSIS — F419 Anxiety disorder, unspecified: Secondary | ICD-10-CM

## 2024-01-26 MED ORDER — CLONAZEPAM 0.5 MG PO TABS: ORAL_TABLET | ORAL | 0 refills | Status: AC

## 2024-03-01 ENCOUNTER — Other Ambulatory Visit: Payer: Self-pay | Admitting: Family Medicine

## 2024-03-01 DIAGNOSIS — G4700 Insomnia, unspecified: Secondary | ICD-10-CM

## 2024-09-08 ENCOUNTER — Other Ambulatory Visit: Payer: Self-pay | Admitting: Obstetrics and Gynecology

## 2024-09-08 DIAGNOSIS — Z1231 Encounter for screening mammogram for malignant neoplasm of breast: Secondary | ICD-10-CM

## 2024-10-01 ENCOUNTER — Ambulatory Visit
Admission: RE | Admit: 2024-10-01 | Discharge: 2024-10-01 | Disposition: A | Discharge status: Home / Self Care | Source: Ambulatory Visit | Attending: Obstetrics and Gynecology | Admitting: Obstetrics and Gynecology

## 2024-10-01 DIAGNOSIS — Z1231 Encounter for screening mammogram for malignant neoplasm of breast: Secondary | ICD-10-CM | POA: Diagnosis present
